# Patient Record
Sex: Male | Born: 1995 | Race: White | Hispanic: No | Marital: Married | Smoking: Current every day smoker
Health system: Southern US, Community
[De-identification: ages and names within clinical notes are randomized; demographics above are authoritative.]

## PROBLEM LIST (undated history)

## (undated) DIAGNOSIS — R519 Headache, unspecified: Secondary | ICD-10-CM

## (undated) DIAGNOSIS — S22009A Unspecified fracture of unspecified thoracic vertebra, initial encounter for closed fracture: Secondary | ICD-10-CM

## (undated) DIAGNOSIS — S0219XA Other fracture of base of skull, initial encounter for closed fracture: Secondary | ICD-10-CM

## (undated) DIAGNOSIS — K219 Gastro-esophageal reflux disease without esophagitis: Secondary | ICD-10-CM

## (undated) DIAGNOSIS — S0285XA Fracture of orbit, unspecified, initial encounter for closed fracture: Secondary | ICD-10-CM

## (undated) DIAGNOSIS — S42109A Fracture of unspecified part of scapula, unspecified shoulder, initial encounter for closed fracture: Secondary | ICD-10-CM

## (undated) DIAGNOSIS — I609 Nontraumatic subarachnoid hemorrhage, unspecified: Secondary | ICD-10-CM

## (undated) HISTORY — DX: Gastro-esophageal reflux disease without esophagitis: K21.9

---
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
## 2016-05-04 ENCOUNTER — Encounter: Payer: Self-pay | Admitting: Family Medicine

## 2016-05-04 ENCOUNTER — Ambulatory Visit (INDEPENDENT_AMBULATORY_CARE_PROVIDER_SITE_OTHER): Payer: BLUE CROSS/BLUE SHIELD | Admitting: Family Medicine

## 2016-05-04 VITALS — BP 110/62 | HR 72 | Ht 73.0 in | Wt 267.0 lb

## 2016-05-04 DIAGNOSIS — K219 Gastro-esophageal reflux disease without esophagitis: Secondary | ICD-10-CM | POA: Diagnosis not present

## 2016-05-04 DIAGNOSIS — R519 Headache, unspecified: Secondary | ICD-10-CM

## 2016-05-04 DIAGNOSIS — G8929 Other chronic pain: Secondary | ICD-10-CM

## 2016-05-04 DIAGNOSIS — R51 Headache: Secondary | ICD-10-CM | POA: Diagnosis not present

## 2016-05-04 MED ORDER — ESOMEPRAZOLE MAGNESIUM 40 MG PO CPDR: 40.0000 mg | DELAYED_RELEASE_CAPSULE | Freq: Every day | ORAL | Status: DC
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
## 2016-05-25 DIAGNOSIS — K219 Gastro-esophageal reflux disease without esophagitis: Secondary | ICD-10-CM | POA: Diagnosis not present

## 2016-05-25 DIAGNOSIS — M62830 Muscle spasm of back: Secondary | ICD-10-CM | POA: Diagnosis not present

## 2016-05-25 DIAGNOSIS — M546 Pain in thoracic spine: Secondary | ICD-10-CM | POA: Diagnosis not present

## 2016-06-29 ENCOUNTER — Ambulatory Visit: Payer: Self-pay | Admitting: Neurology

## 2016-08-06 ENCOUNTER — Ambulatory Visit: Payer: Self-pay | Admitting: Neurology

## 2016-11-15 DIAGNOSIS — S60211A Contusion of right wrist, initial encounter: Secondary | ICD-10-CM | POA: Diagnosis not present

## 2016-12-21 HISTORY — PX: KNEE ARTHROSCOPY W/ MENISCAL REPAIR: SHX1877

## 2016-12-21 HISTORY — PX: KNEE ARTHROSCOPY WITH ANTERIOR CRUCIATE LIGAMENT (ACL) REPAIR: SHX5644

## 2017-01-09 DIAGNOSIS — J209 Acute bronchitis, unspecified: Secondary | ICD-10-CM | POA: Diagnosis not present

## 2017-01-09 DIAGNOSIS — J019 Acute sinusitis, unspecified: Secondary | ICD-10-CM | POA: Diagnosis not present

## 2017-04-09 ENCOUNTER — Ambulatory Visit (INDEPENDENT_AMBULATORY_CARE_PROVIDER_SITE_OTHER): Payer: BLUE CROSS/BLUE SHIELD | Admitting: Family Medicine

## 2017-04-09 ENCOUNTER — Encounter: Payer: Self-pay | Admitting: Family Medicine

## 2017-04-09 VITALS — BP 122/70 | HR 80 | Ht 73.0 in | Wt 262.0 lb

## 2017-04-09 DIAGNOSIS — S8391XA Sprain of unspecified site of right knee, initial encounter: Secondary | ICD-10-CM | POA: Diagnosis not present

## 2017-04-09 DIAGNOSIS — R55 Syncope and collapse: Secondary | ICD-10-CM | POA: Diagnosis not present

## 2017-04-09 DIAGNOSIS — S80211A Abrasion, right knee, initial encounter: Secondary | ICD-10-CM | POA: Diagnosis not present

## 2017-04-09 NOTE — Progress Notes (Signed)
Name: Brendan Ball   MRN: [id number]    DOB: 02/25/1996   Date:04/09/2017       Progress Note  Subjective  Chief Complaint  Chief Complaint  Patient presents with  . Near Syncope    was cleaning a room at work yesterday- suddenly saw black spots and was told he turned white, never went completely out. Went and sat down and drank water and put cold rag on head and felt better.    Near Syncope  This is a new problem. The current episode started yesterday. Episode frequency: one episode. Associated symptoms include a visual change. Pertinent negatives include no abdominal pain, anorexia, arthralgias, change in bowel habit, chest pain, chills, congestion, coughing, diaphoresis, fatigue, fever, headaches, joint swelling, myalgias, nausea, neck pain, numbness, rash, sore throat, swollen glands, urinary symptoms, vertigo, vomiting or weakness. The symptoms are aggravated by bending (cleaning carpet). He has tried nothing for the symptoms.    No problem-specific Assessment & Plan notes found for this encounter.   Past Medical History:  Diagnosis Date  . GERD (gastroesophageal reflux disease)     No past surgical history on file.  Family History  Problem Relation Age of Onset  . Diabetes Mother   . Hypertension Father   . Cancer Maternal Grandmother   . Diabetes Maternal Grandfather   . Heart disease Paternal Grandfather     Social History   Social History  . Marital status: Single    Spouse name: N/A  . Number of children: N/A  . Years of education: N/A   Occupational History  . Not on file.   Social History Main Topics  . Smoking status: Current Every Day Smoker    Types: Cigarettes  . Smokeless tobacco: Never Used  . Alcohol use No  . Drug use: No  . Sexual activity: Yes   Other Topics Concern  . Not on file   Social History Narrative  . No narrative on file    Allergies  Allergen Reactions  . Ibuprofen Anaphylaxis  . Sulfa Antibiotics Other (See Comments)     Outpatient Medications Prior to Visit  Medication Sig Dispense Refill  . esomeprazole (NEXIUM) 40 MG capsule Take 1 capsule (40 mg total) by mouth daily. 30 capsule 11   No facility-administered medications prior to visit.     Review of Systems  Constitutional: Negative for chills, diaphoresis, fatigue, fever, malaise/fatigue and weight loss.  HENT: Negative for congestion, ear discharge, ear pain and sore throat.   Eyes: Negative for blurred vision.  Respiratory: Negative for cough, sputum production, shortness of breath and wheezing.   Cardiovascular: Positive for near-syncope. Negative for chest pain, palpitations, orthopnea and leg swelling.  Gastrointestinal: Negative for abdominal pain, anorexia, blood in stool, change in bowel habit, constipation, diarrhea, heartburn, melena, nausea and vomiting.  Genitourinary: Negative for dysuria, frequency, hematuria and urgency.       No nausea  Musculoskeletal: Negative for arthralgias, back pain, joint pain, joint swelling, myalgias and neck pain.  Skin: Negative for rash.  Neurological: Negative for dizziness, vertigo, tingling, sensory change, focal weakness, weakness, numbness and headaches.  Endo/Heme/Allergies: Negative for environmental allergies and polydipsia. Does not bruise/bleed easily.  Psychiatric/Behavioral: Negative for depression and suicidal ideas. The patient is not nervous/anxious and does not have insomnia.      Objective  Vitals:   04/09/17 1041  BP: 122/70  Pulse: 80  SpO2: 99%  Weight: 262 lb (118.8 kg)  Height:  (1.854 m)    Physical  Exam  Constitutional: He is oriented to person, place, and time and well-developed, well-nourished, and in no distress.  HENT:  Head: Normocephalic.  Right Ear: External ear normal.  Left Ear: External ear normal.  Nose: Nose normal.  Mouth/Throat: Oropharynx is clear and moist.  Eyes: Conjunctivae and EOM are normal. Pupils are equal, round, and reactive to  light. Right eye exhibits no discharge. Left eye exhibits no discharge. No scleral icterus.  Neck: Normal range of motion. Neck supple. No JVD present. No tracheal deviation present. No thyromegaly present.  Cardiovascular: Normal rate, regular rhythm, S1 normal, S2 normal, normal heart sounds and intact distal pulses.  Exam reveals no gallop, no S3, no S4 and no friction rub.   No murmur heard. Pulmonary/Chest: Breath sounds normal. No respiratory distress. He has no wheezes. He has no rales.  Abdominal: Soft. Bowel sounds are normal. He exhibits no mass. There is no hepatosplenomegaly. There is no tenderness. There is no rebound, no guarding and no CVA tenderness.  Musculoskeletal: Normal range of motion. He exhibits no edema or tenderness.  Lymphadenopathy:    He has no cervical adenopathy.  Neurological: He is alert and oriented to person, place, and time. He has normal strength and intact cranial nerves. No cranial nerve deficit.  Skin: Skin is warm. No rash noted.  Psychiatric: Mood and affect normal.  Nursing note and vitals reviewed.     Assessment & Plan  Problem List Items Addressed This Visit    None    Visit Diagnoses    Near syncope    -  Primary   Relevant Orders   EKG 12-Lead (Completed)   Renal Function Panel   CBC with Differential/Platelet   Vasovagal episode          No orders of the defined types were placed in this encounter.     Dr. [facility]  04/09/17

## 2017-04-10 LAB — RENAL FUNCTION PANEL
Albumin: 4.6 g/dL (ref 3.5–5.5)
BUN/Creatinine Ratio: 16 (ref 9–20)
BUN: 16 mg/dL (ref 6–20)
CO2: 22 mmol/L (ref 18–29)
Calcium: 9.5 mg/dL (ref 8.7–10.2)
Chloride: 102 mmol/L (ref 96–106)
Creatinine, Ser: 0.98 mg/dL (ref 0.76–1.27)
GFR calc Af Amer: 125 mL/min/{1.73_m2} (ref >59)
GFR calc non Af Amer: 108 mL/min/{1.73_m2} (ref >59)
Glucose: 79 mg/dL (ref 65–99)
Phosphorus: 3.5 mg/dL (ref 2.5–4.5)
Potassium: 4.7 mmol/L (ref 3.5–5.2)
Sodium: 141 mmol/L (ref 134–144)

## 2017-04-10 LAB — CBC WITH DIFFERENTIAL/PLATELET
Basophils Absolute: 0.1 10*3/uL (ref 0.0–0.2)
Basos: 0 %
EOS (ABSOLUTE): 0.3 10*3/uL (ref 0.0–0.4)
Eos: 1 %
Hematocrit: 46.3 % (ref 37.5–51.0)
Hemoglobin: 15.7 g/dL (ref 13.0–17.7)
Immature Grans (Abs): 0 10*3/uL (ref 0.0–0.1)
Immature Granulocytes: 0 %
Lymphocytes Absolute: 3.7 10*3/uL — ABNORMAL HIGH (ref 0.7–3.1)
Lymphs: 19 %
MCH: 29.9 pg (ref 26.6–33.0)
MCHC: 33.9 g/dL (ref 31.5–35.7)
MCV: 88 fL (ref 79–97)
Monocytes Absolute: 1.2 10*3/uL — ABNORMAL HIGH (ref 0.1–0.9)
Monocytes: 6 %
Neutrophils Absolute: 14 10*3/uL — ABNORMAL HIGH (ref 1.4–7.0)
Neutrophils: 74 %
Platelets: 207 10*3/uL (ref 150–379)
RBC: 5.25 x10E6/uL (ref 4.14–5.80)
RDW: 13.6 % (ref 12.3–15.4)
WBC: 19.2 10*3/uL — ABNORMAL HIGH (ref 3.4–10.8)

## 2017-04-12 ENCOUNTER — Other Ambulatory Visit: Payer: Self-pay

## 2017-04-12 DIAGNOSIS — S83411A Sprain of medial collateral ligament of right knee, initial encounter: Secondary | ICD-10-CM | POA: Diagnosis not present

## 2017-04-12 MED ORDER — AMOXICILLIN 500 MG PO CAPS: 500.0000 mg | ORAL_CAPSULE | Freq: Three times a day (TID) | ORAL | 0 refills | Status: DC

## 2017-04-20 DIAGNOSIS — S83411D Sprain of medial collateral ligament of right knee, subsequent encounter: Secondary | ICD-10-CM | POA: Diagnosis not present

## 2017-06-03 ENCOUNTER — Other Ambulatory Visit: Payer: Self-pay

## 2017-06-03 DIAGNOSIS — K219 Gastro-esophageal reflux disease without esophagitis: Secondary | ICD-10-CM

## 2017-06-03 MED ORDER — ESOMEPRAZOLE MAGNESIUM 40 MG PO CPDR: 40.0000 mg | DELAYED_RELEASE_CAPSULE | Freq: Every day | ORAL | 11 refills | Status: DC

## 2017-06-14 DIAGNOSIS — S83221A Peripheral tear of medial meniscus, current injury, right knee, initial encounter: Secondary | ICD-10-CM | POA: Diagnosis not present

## 2017-06-16 DIAGNOSIS — S83221A Peripheral tear of medial meniscus, current injury, right knee, initial encounter: Secondary | ICD-10-CM | POA: Diagnosis not present

## 2017-06-22 DIAGNOSIS — S83511A Sprain of anterior cruciate ligament of right knee, initial encounter: Secondary | ICD-10-CM | POA: Diagnosis not present

## 2017-07-12 DIAGNOSIS — Z01818 Encounter for other preprocedural examination: Secondary | ICD-10-CM | POA: Diagnosis not present

## 2017-07-14 DIAGNOSIS — S83511D Sprain of anterior cruciate ligament of right knee, subsequent encounter: Secondary | ICD-10-CM | POA: Diagnosis not present

## 2017-07-14 DIAGNOSIS — G8918 Other acute postprocedural pain: Secondary | ICD-10-CM | POA: Diagnosis not present

## 2017-07-14 DIAGNOSIS — M25561 Pain in right knee: Secondary | ICD-10-CM | POA: Diagnosis not present

## 2017-07-14 DIAGNOSIS — F17210 Nicotine dependence, cigarettes, uncomplicated: Secondary | ICD-10-CM | POA: Diagnosis not present

## 2017-07-14 DIAGNOSIS — K219 Gastro-esophageal reflux disease without esophagitis: Secondary | ICD-10-CM | POA: Diagnosis not present

## 2017-07-14 DIAGNOSIS — S83511A Sprain of anterior cruciate ligament of right knee, initial encounter: Secondary | ICD-10-CM | POA: Diagnosis not present

## 2017-07-14 DIAGNOSIS — S83261D Peripheral tear of lateral meniscus, current injury, right knee, subsequent encounter: Secondary | ICD-10-CM | POA: Diagnosis not present

## 2017-07-14 DIAGNOSIS — S83271A Complex tear of lateral meniscus, current injury, right knee, initial encounter: Secondary | ICD-10-CM | POA: Diagnosis not present

## 2017-07-16 DIAGNOSIS — S83511D Sprain of anterior cruciate ligament of right knee, subsequent encounter: Secondary | ICD-10-CM | POA: Diagnosis not present

## 2017-07-19 DIAGNOSIS — S83511D Sprain of anterior cruciate ligament of right knee, subsequent encounter: Secondary | ICD-10-CM | POA: Diagnosis not present

## 2017-07-21 DIAGNOSIS — S83511D Sprain of anterior cruciate ligament of right knee, subsequent encounter: Secondary | ICD-10-CM | POA: Diagnosis not present

## 2017-07-23 DIAGNOSIS — S83511D Sprain of anterior cruciate ligament of right knee, subsequent encounter: Secondary | ICD-10-CM | POA: Diagnosis not present

## 2017-07-27 DIAGNOSIS — S83511D Sprain of anterior cruciate ligament of right knee, subsequent encounter: Secondary | ICD-10-CM | POA: Diagnosis not present

## 2017-07-27 DIAGNOSIS — R269 Unspecified abnormalities of gait and mobility: Secondary | ICD-10-CM | POA: Diagnosis not present

## 2017-07-30 DIAGNOSIS — S83511D Sprain of anterior cruciate ligament of right knee, subsequent encounter: Secondary | ICD-10-CM | POA: Diagnosis not present

## 2017-08-03 DIAGNOSIS — S83511D Sprain of anterior cruciate ligament of right knee, subsequent encounter: Secondary | ICD-10-CM | POA: Diagnosis not present

## 2017-08-06 DIAGNOSIS — S83511D Sprain of anterior cruciate ligament of right knee, subsequent encounter: Secondary | ICD-10-CM | POA: Diagnosis not present

## 2017-08-10 DIAGNOSIS — S83511D Sprain of anterior cruciate ligament of right knee, subsequent encounter: Secondary | ICD-10-CM | POA: Diagnosis not present

## 2017-08-12 DIAGNOSIS — S83511D Sprain of anterior cruciate ligament of right knee, subsequent encounter: Secondary | ICD-10-CM | POA: Diagnosis not present

## 2017-08-17 DIAGNOSIS — S83511D Sprain of anterior cruciate ligament of right knee, subsequent encounter: Secondary | ICD-10-CM | POA: Diagnosis not present

## 2017-08-19 DIAGNOSIS — S83511D Sprain of anterior cruciate ligament of right knee, subsequent encounter: Secondary | ICD-10-CM | POA: Diagnosis not present

## 2018-01-20 DIAGNOSIS — J019 Acute sinusitis, unspecified: Secondary | ICD-10-CM | POA: Diagnosis not present

## 2018-03-16 DIAGNOSIS — R197 Diarrhea, unspecified: Secondary | ICD-10-CM | POA: Diagnosis not present

## 2018-03-16 DIAGNOSIS — L304 Erythema intertrigo: Secondary | ICD-10-CM | POA: Diagnosis not present

## 2018-04-11 DIAGNOSIS — J029 Acute pharyngitis, unspecified: Secondary | ICD-10-CM | POA: Diagnosis not present

## 2018-04-11 DIAGNOSIS — Z6836 Body mass index (BMI) 36.0-36.9, adult: Secondary | ICD-10-CM | POA: Diagnosis not present

## 2018-04-11 DIAGNOSIS — J329 Chronic sinusitis, unspecified: Secondary | ICD-10-CM | POA: Diagnosis not present

## 2018-04-11 DIAGNOSIS — J020 Streptococcal pharyngitis: Secondary | ICD-10-CM | POA: Diagnosis not present

## 2018-06-24 ENCOUNTER — Other Ambulatory Visit: Payer: Self-pay | Admitting: Family Medicine

## 2018-06-24 DIAGNOSIS — K219 Gastro-esophageal reflux disease without esophagitis: Secondary | ICD-10-CM

## 2018-07-14 DIAGNOSIS — M25561 Pain in right knee: Secondary | ICD-10-CM | POA: Diagnosis not present

## 2018-08-11 DIAGNOSIS — M25561 Pain in right knee: Secondary | ICD-10-CM | POA: Diagnosis not present

## 2018-08-17 DIAGNOSIS — Z0189 Encounter for other specified special examinations: Secondary | ICD-10-CM | POA: Diagnosis not present

## 2018-08-17 DIAGNOSIS — M25561 Pain in right knee: Secondary | ICD-10-CM | POA: Diagnosis not present

## 2018-08-25 DIAGNOSIS — S83281D Other tear of lateral meniscus, current injury, right knee, subsequent encounter: Secondary | ICD-10-CM | POA: Diagnosis not present

## 2018-08-25 DIAGNOSIS — S83511D Sprain of anterior cruciate ligament of right knee, subsequent encounter: Secondary | ICD-10-CM | POA: Diagnosis not present

## 2018-09-01 DIAGNOSIS — S83511D Sprain of anterior cruciate ligament of right knee, subsequent encounter: Secondary | ICD-10-CM | POA: Diagnosis not present

## 2018-12-15 ENCOUNTER — Other Ambulatory Visit: Payer: Self-pay | Admitting: Family Medicine

## 2018-12-15 DIAGNOSIS — K219 Gastro-esophageal reflux disease without esophagitis: Secondary | ICD-10-CM

## 2019-01-17 ENCOUNTER — Encounter: Payer: Self-pay | Admitting: Family Medicine

## 2019-01-17 ENCOUNTER — Ambulatory Visit (INDEPENDENT_AMBULATORY_CARE_PROVIDER_SITE_OTHER): Payer: BLUE CROSS/BLUE SHIELD | Admitting: Family Medicine

## 2019-01-17 VITALS — BP 120/62 | HR 100 | Ht 73.0 in | Wt 286.0 lb

## 2019-01-17 DIAGNOSIS — J0100 Acute maxillary sinusitis, unspecified: Secondary | ICD-10-CM | POA: Diagnosis not present

## 2019-01-17 DIAGNOSIS — K219 Gastro-esophageal reflux disease without esophagitis: Secondary | ICD-10-CM | POA: Diagnosis not present

## 2019-01-17 MED ORDER — ESOMEPRAZOLE MAGNESIUM 40 MG PO CPDR: 40.0000 mg | DELAYED_RELEASE_CAPSULE | Freq: Every day | ORAL | 1 refills | Status: DC

## 2019-01-17 MED ORDER — AZITHROMYCIN 250 MG PO TABS: ORAL_TABLET | ORAL | 0 refills | Status: DC

## 2019-01-17 NOTE — Progress Notes (Signed)
Date:  01/17/2019   Name:  Brendan Ball   DOB:  1996/09/11   MRN:  [id number]   Chief Complaint: Gastroesophageal Reflux and Sinusitis (cong, cough x 4 days)  Gastroesophageal Reflux  He complains of heartburn. He reports no abdominal pain, no belching, no chest pain, no choking, no coughing, no dysphagia, no early satiety, no globus sensation, no hoarse voice, no nausea, no sore throat, no stridor or no wheezing. This is a chronic problem. The current episode started more than 1 year ago. The problem occurs rarely. The problem has been gradually improving. The heartburn is of mild intensity. The heartburn does not wake him from sleep. The heartburn does not limit his activity. The symptoms are aggravated by certain foods. Pertinent negatives include no anemia, fatigue, melena, muscle weakness or orthopnea. He has tried a PPI for the symptoms. The treatment provided moderate relief.  Sinusitis  This is a new problem. The current episode started in the past 7 days (onset Saturday). The problem has been waxing and waning since onset. There has been no fever. Associated symptoms include congestion, headaches and sinus pressure. Pertinent negatives include no chills, coughing, diaphoresis, ear pain, hoarse voice, neck pain, shortness of breath or sore throat. Past treatments include oral decongestants. The treatment provided mild relief.    Review of Systems  Constitutional: Negative for chills, diaphoresis, fatigue and fever.  HENT: Positive for congestion and sinus pressure. Negative for drooling, ear discharge, ear pain, hoarse voice and sore throat.   Respiratory: Negative for cough, choking, shortness of breath and wheezing.   Cardiovascular: Negative for chest pain, palpitations and leg swelling.  Gastrointestinal: Positive for heartburn. Negative for abdominal pain, blood in stool, constipation, diarrhea, dysphagia, melena and nausea.  Endocrine: Negative for polydipsia.  Genitourinary:  Negative for dysuria, frequency, hematuria and urgency.  Musculoskeletal: Negative for back pain, myalgias, muscle weakness and neck pain.  Skin: Negative for rash.  Allergic/Immunologic: Negative for environmental allergies.  Neurological: Positive for headaches. Negative for dizziness.  Hematological: Does not bruise/bleed easily.  Psychiatric/Behavioral: Negative for suicidal ideas. The patient is not nervous/anxious.     Patient Active Problem List   Diagnosis Date Noted  . Fracture of scapula 10/31/2012  . Fracture of orbit 10/12/2012  . 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 Fracture of temporal bone (HCC) 10/12/2012  . Fracture of thoracic spine (HCC) 10/12/2012    Allergies  Allergen Reactions  . Ibuprofen Anaphylaxis  . Sulfa Antibiotics Other (See Comments)    No past surgical history on file.  Social History   Tobacco Use  . Smoking status: Current Every Day Smoker    Types: Cigarettes  . Smokeless tobacco: Never Used  Substance Use Topics  . Alcohol use: No    Alcohol/week: 0.0 standard drinks  . Drug use: No     Medication list has been reviewed and updated.  Current Meds  Medication Sig  . esomeprazole (NEXIUM) 40 MG capsule TAKE ONE CAPSULE BY MOUTH DAILY    No flowsheet data found.  Physical Exam Vitals signs and nursing note reviewed.  HENT:     Head: Normocephalic.     Right Ear: Tympanic membrane, ear canal and external ear normal.     Left Ear: Tympanic membrane, ear canal and external ear normal.     Nose: Rhinorrhea present. No nasal deformity, signs of injury, nasal tenderness or congestion.     Right Sinus: No maxillary sinus tenderness or frontal sinus tenderness.  Left Sinus: Maxillary sinus tenderness present. No frontal sinus tenderness.     Mouth/Throat:     Pharynx: Oropharynx is clear. Uvula midline. No pharyngeal swelling or oropharyngeal exudate.  Eyes:     General: No scleral icterus.        Right eye: No discharge.        Left eye: No discharge.     Conjunctiva/sclera: Conjunctivae normal.     Pupils: Pupils are equal, round, and reactive to light.  Neck:     Musculoskeletal: Normal range of motion and neck supple.     Thyroid: No thyromegaly.     Vascular: No JVD.     Trachea: No tracheal deviation.  Cardiovascular:     Rate and Rhythm: Normal rate and regular rhythm.     Heart sounds: Normal heart sounds. No murmur. No friction rub. No gallop.   Pulmonary:     Effort: No respiratory distress.     Breath sounds: Normal breath sounds. No decreased air movement. No decreased breath sounds, wheezing, rhonchi or rales.  Abdominal:     General: Bowel sounds are normal.     Palpations: Abdomen is soft. There is no mass.     Tenderness: There is no abdominal tenderness. There is no guarding or rebound.  Musculoskeletal: Normal range of motion.        General: No tenderness.  Lymphadenopathy:     Head:     Right side of head: No submandibular adenopathy.     Left side of head: No submandibular adenopathy.     Cervical: No cervical adenopathy.  Skin:    General: Skin is warm.     Findings: No rash.  Neurological:     Mental Status: He is alert and oriented to person, place, and time.     Cranial Nerves: No cranial nerve deficit.     Deep Tendon Reflexes: Reflexes are normal and symmetric.     BP 120/62   Pulse 100   Ht 6\' 1"  (1.854 m)   Wt 286 lb (129.7 kg)   BMI 37.73 kg/m     Assessment and Plan: 1. Gastroesophageal reflux disease, esophagitis presence not specified Chronic.  Persistent.  Will continue Nexium 40 mg once a day - esomeprazole (NEXIUM) 40 MG capsule; Take 1 capsule (40 mg total) by mouth daily.  Dispense: 90 capsule; Refill: 1  2. Acute maxillary sinusitis, recurrence not specified Acute.  Exam consistent with left maxillary sinusitis initiate a azithromycin 250 mg 2 today followed by 1 a day for 4 days. - azithromycin (ZITHROMAX) 250 MG tablet;  2 today then 1 a day  For 4 days  Dispense: 6 tablet; Refill: 0

## 2019-05-29 ENCOUNTER — Other Ambulatory Visit: Payer: Self-pay

## 2019-05-29 DIAGNOSIS — K219 Gastro-esophageal reflux disease without esophagitis: Secondary | ICD-10-CM

## 2019-05-29 MED ORDER — ESOMEPRAZOLE MAGNESIUM 40 MG PO CPDR: 40.0000 mg | DELAYED_RELEASE_CAPSULE | Freq: Every day | ORAL | 1 refills | Status: DC

## 2019-07-04 ENCOUNTER — Other Ambulatory Visit: Payer: Self-pay

## 2019-07-04 ENCOUNTER — Encounter: Payer: Self-pay | Admitting: Family Medicine

## 2019-07-04 ENCOUNTER — Ambulatory Visit (INDEPENDENT_AMBULATORY_CARE_PROVIDER_SITE_OTHER): Payer: BC Managed Care – PPO | Admitting: Family Medicine

## 2019-07-04 VITALS — BP 118/60 | HR 80 | Ht 73.0 in | Wt 274.0 lb

## 2019-07-04 DIAGNOSIS — M791 Myalgia, unspecified site: Secondary | ICD-10-CM

## 2019-07-04 DIAGNOSIS — K219 Gastro-esophageal reflux disease without esophagitis: Secondary | ICD-10-CM | POA: Diagnosis not present

## 2019-07-04 DIAGNOSIS — B379 Candidiasis, unspecified: Secondary | ICD-10-CM | POA: Diagnosis not present

## 2019-07-04 MED ORDER — ESOMEPRAZOLE MAGNESIUM 40 MG PO CPDR: 40.0000 mg | DELAYED_RELEASE_CAPSULE | Freq: Every day | ORAL | 3 refills | Status: DC

## 2019-07-04 MED ORDER — NYSTATIN 100000 UNIT/GM EX CREA: 1.0000 "application " | TOPICAL_CREAM | Freq: Two times a day (BID) | CUTANEOUS | 0 refills | Status: DC

## 2019-07-04 NOTE — Progress Notes (Signed)
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 This is a chronic problem. The current episode started more than 1 year ago. The problem has been waxing and waning. Pertinent negatives include no anemia, fatigue, melena, muscle weakness, orthopnea or weight loss. There are no known risk factors. He has tried a PPI for the symptoms. The treatment provided moderate relief. Past procedures do not include an abdominal ultrasound, an EGD, esophageal manometry, esophageal pH monitoring, H. pylori antibody titer or a UGI.  Muscle Pain This is a chronic problem. The current episode started 1 to 4 weeks ago. The problem occurs intermittently. The problem has been waxing and waning since onset. Pain location: arms and legs. The pain is mild. Nothing aggravates the symptoms. Pertinent negatives include no abdominal pain, chest pain, constipation, diarrhea, dysuria, fatigue, fever, headaches, nausea, rash, shortness of breath, weakness or wheezing. Treatments tried: hydration.    Review of Systems  Constitutional: Negative for chills, fatigue, fever and weight loss.  HENT: Negative for drooling, ear discharge, ear pain, hoarse voice and sore throat.   Respiratory: Negative for cough, choking, shortness of breath and wheezing.   Cardiovascular: Negative for chest pain, palpitations and leg swelling.  Gastrointestinal: Negative for abdominal pain, blood in stool, constipation, diarrhea,  dysphagia, heartburn, melena and nausea.  Endocrine: Negative for polydipsia.  Genitourinary: Negative for dysuria, frequency, hematuria and urgency.  Musculoskeletal: Positive for myalgias. Negative for arthralgias, back pain, muscle weakness, neck pain and neck stiffness.  Skin: Negative for rash.  Allergic/Immunologic: Negative for environmental allergies.  Neurological: Negative for dizziness, tremors, weakness, numbness and headaches.  Hematological: Does not bruise/bleed easily.  Psychiatric/Behavioral: Negative for suicidal ideas. The patient is not nervous/anxious.     Patient Active Problem List   Diagnosis Date Noted  . Fracture of scapula 10/31/2012  . Fracture of orbit 10/12/2012  . Hemorrhage into subarachnoid space of neuraxis ([place]) 10/12/2012  . Fracture of temporal bone ([place]) 10/12/2012  . Fracture of thoracic spine ([place]) 10/12/2012    Allergies  Allergen Reactions  . Ibuprofen Anaphylaxis  . Sulfa Antibiotics Other (See Comments)    No past surgical history on file.  Social History   Tobacco Use  . Smoking status: Current Every Day Smoker    Types: Cigarettes  . Smokeless tobacco: Never Used  Substance Use Topics  . Alcohol use: No    Alcohol/week: 0.0 standard drinks  . Drug use: No     Medication list has been reviewed and updated.  Current Meds  Medication Sig  . esomeprazole (NEXIUM) 40 MG capsule Take 1 capsule (40 mg total) by mouth daily.    PHQ 2/9 Scores 07/04/2019  PHQ - 2 Score 0  PHQ- 9 Score 2    BP Readings from Last 3 Encounters:  07/04/19 118/60  01/17/19 120/62  04/09/17 122/70    Physical Exam Vitals signs and nursing note reviewed.  HENT:     Head: Normocephalic.  Right Ear: Tympanic membrane, ear canal and external ear normal.     Left Ear: Tympanic membrane, ear canal and external ear normal.     Nose: Nose normal. No congestion or rhinorrhea.     Mouth/Throat:     Mouth: Mucous membranes are moist.  Eyes:      General: No scleral icterus.       Right eye: No discharge.        Left eye: No discharge.     Conjunctiva/sclera: Conjunctivae normal.     Pupils: Pupils are equal, round, and reactive to light.  Neck:     Musculoskeletal: Normal range of motion and neck supple. No muscular tenderness.     Thyroid: No thyromegaly.     Vascular: No carotid bruit or JVD.     Trachea: No tracheal deviation.  Cardiovascular:     Rate and Rhythm: Normal rate and regular rhythm.     Pulses:          Carotid pulses are 2+ on the right side and 2+ on the left side.      Radial pulses are 2+ on the right side and 2+ on the left side.       Femoral pulses are 2+ on the right side and 2+ on the left side.      Popliteal pulses are 2+ on the right side and 2+ on the left side.       Dorsalis pedis pulses are 2+ on the right side and 2+ on the left side.       Posterior tibial pulses are 2+ on the right side and 2+ on the left side.     Heart sounds: Normal heart sounds, S1 normal and S2 normal. No murmur. No systolic murmur. No diastolic murmur. No friction rub. No gallop. No S3 or S4 sounds.   Pulmonary:     Effort: No respiratory distress.     Breath sounds: Normal breath sounds. No wheezing or rales.  Abdominal:     General: Bowel sounds are normal.     Palpations: Abdomen is soft. There is no mass.     Tenderness: There is no abdominal tenderness. There is no guarding or rebound.  Musculoskeletal: Normal range of motion.        General: No tenderness.     Right lower leg: No edema.     Left lower leg: No edema.  Lymphadenopathy:     Cervical: No cervical adenopathy.  Skin:    General: Skin is warm.     Findings: No rash.  Neurological:     Mental Status: He is alert and oriented to person, place, and time.     Cranial Nerves: No cranial nerve deficit.     Deep Tendon Reflexes: Reflexes are normal and symmetric.     Wt Readings from Last 3 Encounters:  07/04/19 274 lb (124.3 kg)  01/17/19 286 lb  (129.7 kg)  04/09/17 262 lb (118.8 kg)    BP 118/60   Pulse 80   Ht 6\' 1"  (1.854 m)   Wt 274 lb (124.3 kg)   BMI 36.15 kg/m   Assessment and Plan: 1. Myalgia Patient has been having muscle cramps on a more frequent basis.  We have discussed his hydration status, electrolyte necessity, as well as muscle fatigue, all of which are possibilities given his line of work.  We will check a renal function panel as well as an A1c as well as a creatinine kinase to rule  out any rhabdo possibilities.  In the meantime information on trip channels was given and the relationship to muscle cramps. - Renal Function Panel - Hemoglobin A1c - CK (Creatine Kinase)  2. Gastroesophageal reflux disease, esophagitis presence not specified Chronic.  Controlled.  Continue Nexium 40 mg once a day - esomeprazole (NEXIUM) 40 MG capsule; Take 1 capsule (40 mg total) by mouth daily.  Dispense: 90 capsule; Refill: 3  3. Yeast infection Acute.  Noted due to swelling in the lower abdomen area will apply nystatin cream twice a day. - nystatin cream (MYCOSTATIN); Apply 1 application topically 2 (two) times daily.  Dispense: 30 g; Refill: 0

## 2019-07-04 NOTE — Patient Instructions (Signed)
Muscle Cramps and Spasms Muscle cramps and spasms occur when a muscle or muscles tighten and you have no control over this tightening (involuntary muscle contraction). They are a common problem and can develop in any muscle. The most common place is in the calf muscles of the leg. Muscle cramps and muscle spasms are both involuntary muscle contractions, but there are some differences between the two:  Muscle cramps are painful. They come and go and may last for a few seconds or up to 15 minutes. Muscle cramps are often more forceful and last longer than muscle spasms.  Muscle spasms may or may not be painful. They may also last just a few seconds or much longer. Certain medical conditions, such as diabetes or Parkinson's disease, can make it more likely to develop cramps or spasms. However, cramps or spasms are usually not caused by a serious underlying problem. Common causes include:  Doing more physical work or exercise than your body is ready for (overexertion).  Overuse from repeating certain movements too many times.  Remaining in a certain position for a long period of time.  Improper preparation, form, or technique while playing a sport or doing an activity.  Dehydration.  Injury.  Side effects of some medicines.  Abnormally low levels of the salts and minerals in your blood (electrolytes), especially potassium and calcium. This could happen if you are taking water pills (diuretics) or if you are pregnant. In many cases, the cause of muscle cramps or spasms is not known. Follow these instructions at home: Managing pain and stiffness      Try massaging, stretching, and relaxing the affected muscle. Do this for several minutes at a time.  If directed, apply heat to tight or tense muscles as often as told by your health care provider. Use the heat source that your health care provider recommends, such as a moist heat pack or a heating pad. ? Place a towel between your skin and  the heat source. ? Leave the heat on for 20-30 minutes. ? Remove the heat if your skin turns bright red. This is especially important if you are unable to feel pain, heat, or cold. You may have a greater risk of getting burned.  If directed, put ice on the affected area. This may help if you are sore or have pain after a cramp or spasm. ? Put ice in a plastic bag. ? Place a towel between your skin and the bag. ? Leavethe ice on for 20 minutes, 2-3 times a day.  Try taking hot showers or baths to help relax tight muscles. Eating and drinking  Drink enough fluid to keep your urine pale yellow. Staying well hydrated may help prevent cramps or spasms.  Eat a healthy diet that includes plenty of nutrients to help your muscles function. A healthy diet includes fruits and vegetables, lean protein, whole grains, and low-fat or nonfat dairy products. General instructions  If you are having frequent cramps, avoid intense exercise for several days.  Take over-the-counter and prescription medicines only as told by your health care provider.  Pay attention to any changes in your symptoms.  Keep all follow-up visits as told by your health care provider. This is important. Contact a health care provider if:  Your cramps or spasms get more severe or happen more often.  Your cramps or spasms do not improve over time. Summary  Muscle cramps and spasms occur when a muscle or muscles tighten and you have no control over this   tightening (involuntary muscle contraction).  The most common place for cramps or spasms to occur is in the calf muscles of the leg.  Massaging, stretching, and relaxing the affected muscle may relieve the cramp or spasm.  Drink enough fluid to keep your urine pale yellow. Staying well hydrated may help prevent cramps or spasms. This information is not intended to replace advice given to you by your health care provider. Make sure you discuss any questions you have with your  health care provider. Document Released: 05/29/2002 Document Revised: 05/02/2018 Document Reviewed: 05/02/2018 Elsevier Patient Education  2020 [employer]  

## 2019-07-05 LAB — RENAL FUNCTION PANEL
Albumin: 4.9 g/dL (ref 4.1–5.2)
BUN/Creatinine Ratio: 21 — ABNORMAL HIGH (ref 9–20)
BUN: 23 mg/dL — ABNORMAL HIGH (ref 6–20)
CO2: 18 mmol/L — ABNORMAL LOW (ref 20–29)
Calcium: 9.5 mg/dL (ref 8.7–10.2)
Chloride: 101 mmol/L (ref 96–106)
Creatinine, Ser: 1.09 mg/dL (ref 0.76–1.27)
GFR calc Af Amer: 108 mL/min/{1.73_m2} (ref >59)
GFR calc non Af Amer: 94 mL/min/{1.73_m2} (ref >59)
Glucose: 71 mg/dL (ref 65–99)
Phosphorus: 3.9 mg/dL (ref 2.8–4.1)
Potassium: 4.4 mmol/L (ref 3.5–5.2)
Sodium: 137 mmol/L (ref 134–144)

## 2019-07-05 LAB — HEMOGLOBIN A1C
Est. average glucose Bld gHb Est-mCnc: 105 mg/dL
Hgb A1c MFr Bld: 5.3 % (ref 4.8–5.6)

## 2019-07-05 LAB — CK: Total CK: 398 U/L (ref 49–439)

## 2019-07-19 ENCOUNTER — Ambulatory Visit: Payer: BLUE CROSS/BLUE SHIELD | Admitting: Family Medicine

## 2019-07-19 DIAGNOSIS — R197 Diarrhea, unspecified: Secondary | ICD-10-CM | POA: Diagnosis not present

## 2019-07-19 DIAGNOSIS — R110 Nausea: Secondary | ICD-10-CM | POA: Diagnosis not present

## 2019-08-09 ENCOUNTER — Ambulatory Visit (INDEPENDENT_AMBULATORY_CARE_PROVIDER_SITE_OTHER): Payer: BC Managed Care – PPO | Admitting: Family Medicine

## 2019-08-09 ENCOUNTER — Other Ambulatory Visit: Payer: Self-pay

## 2019-08-09 ENCOUNTER — Encounter: Payer: Self-pay | Admitting: Family Medicine

## 2019-08-09 VITALS — BP 120/68 | HR 80 | Ht 73.0 in | Wt 261.0 lb

## 2019-08-09 DIAGNOSIS — R51 Headache: Secondary | ICD-10-CM | POA: Diagnosis not present

## 2019-08-09 DIAGNOSIS — Z8781 Personal history of (healed) traumatic fracture: Secondary | ICD-10-CM

## 2019-08-09 DIAGNOSIS — R110 Nausea: Secondary | ICD-10-CM | POA: Diagnosis not present

## 2019-08-09 DIAGNOSIS — Z8679 Personal history of other diseases of the circulatory system: Secondary | ICD-10-CM

## 2019-08-09 DIAGNOSIS — Z9889 Other specified postprocedural states: Secondary | ICD-10-CM

## 2019-08-09 DIAGNOSIS — R519 Headache, unspecified: Secondary | ICD-10-CM

## 2019-08-09 MED ORDER — ONDANSETRON HCL 4 MG PO TABS: 4.0000 mg | ORAL_TABLET | Freq: Three times a day (TID) | ORAL | 0 refills | Status: DC | PRN
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
## 2019-08-31 DIAGNOSIS — E559 Vitamin D deficiency, unspecified: Secondary | ICD-10-CM | POA: Diagnosis not present

## 2019-08-31 DIAGNOSIS — G43119 Migraine with aura, intractable, without status migrainosus: Secondary | ICD-10-CM | POA: Diagnosis not present

## 2019-08-31 DIAGNOSIS — F5104 Psychophysiologic insomnia: Secondary | ICD-10-CM | POA: Diagnosis not present

## 2019-08-31 DIAGNOSIS — E538 Deficiency of other specified B group vitamins: Secondary | ICD-10-CM | POA: Diagnosis not present

## 2019-09-06 DIAGNOSIS — G43119 Migraine with aura, intractable, without status migrainosus: Secondary | ICD-10-CM | POA: Insufficient documentation

## 2019-10-06 DIAGNOSIS — Z23 Encounter for immunization: Secondary | ICD-10-CM | POA: Diagnosis not present

## 2020-01-01 DIAGNOSIS — G43119 Migraine with aura, intractable, without status migrainosus: Secondary | ICD-10-CM | POA: Diagnosis not present

## 2020-01-01 DIAGNOSIS — F5104 Psychophysiologic insomnia: Secondary | ICD-10-CM | POA: Diagnosis not present

## 2020-01-28 DIAGNOSIS — G4733 Obstructive sleep apnea (adult) (pediatric): Secondary | ICD-10-CM | POA: Diagnosis not present

## 2020-09-04 DIAGNOSIS — G43909 Migraine, unspecified, not intractable, without status migrainosus: Secondary | ICD-10-CM | POA: Diagnosis not present

## 2020-09-04 DIAGNOSIS — S0591XA Unspecified injury of right eye and orbit, initial encounter: Secondary | ICD-10-CM | POA: Diagnosis not present

## 2020-09-04 DIAGNOSIS — H905 Unspecified sensorineural hearing loss: Secondary | ICD-10-CM | POA: Diagnosis not present

## 2020-09-04 DIAGNOSIS — Z886 Allergy status to analgesic agent status: Secondary | ICD-10-CM | POA: Diagnosis not present

## 2020-09-04 DIAGNOSIS — K219 Gastro-esophageal reflux disease without esophagitis: Secondary | ICD-10-CM | POA: Diagnosis not present

## 2020-09-04 DIAGNOSIS — Z882 Allergy status to sulfonamides status: Secondary | ICD-10-CM | POA: Diagnosis not present

## 2020-09-04 DIAGNOSIS — T1501XA Foreign body in cornea, right eye, initial encounter: Secondary | ICD-10-CM | POA: Diagnosis not present

## 2020-09-04 DIAGNOSIS — Z79899 Other long term (current) drug therapy: Secondary | ICD-10-CM | POA: Diagnosis not present

## 2020-09-04 DIAGNOSIS — S0551XA Penetrating wound with foreign body of right eyeball, initial encounter: Secondary | ICD-10-CM | POA: Diagnosis not present

## 2020-09-04 DIAGNOSIS — F17210 Nicotine dependence, cigarettes, uncomplicated: Secondary | ICD-10-CM | POA: Diagnosis not present

## 2020-09-04 DIAGNOSIS — W311XXA Contact with metalworking machines, initial encounter: Secondary | ICD-10-CM | POA: Diagnosis not present

## 2020-09-06 DIAGNOSIS — T1501XA Foreign body in cornea, right eye, initial encounter: Secondary | ICD-10-CM | POA: Diagnosis not present

## 2020-09-13 DIAGNOSIS — T1501XD Foreign body in cornea, right eye, subsequent encounter: Secondary | ICD-10-CM | POA: Diagnosis not present

## 2020-09-16 ENCOUNTER — Other Ambulatory Visit: Payer: Self-pay | Admitting: Family Medicine

## 2020-09-16 DIAGNOSIS — K219 Gastro-esophageal reflux disease without esophagitis: Secondary | ICD-10-CM

## 2020-09-16 NOTE — Telephone Encounter (Signed)
Call to patient- he is at work and wants to call back to schedule- courtesy RF given.

## 2020-09-23 ENCOUNTER — Ambulatory Visit (INDEPENDENT_AMBULATORY_CARE_PROVIDER_SITE_OTHER): Payer: BC Managed Care – PPO | Admitting: Family Medicine

## 2020-09-23 ENCOUNTER — Other Ambulatory Visit: Payer: Self-pay

## 2020-09-23 ENCOUNTER — Encounter: Payer: Self-pay | Admitting: Family Medicine

## 2020-09-23 VITALS — BP 124/70 | HR 124 | Ht 73.0 in | Wt 306.0 lb

## 2020-09-23 DIAGNOSIS — M545 Low back pain, unspecified: Secondary | ICD-10-CM

## 2020-09-23 DIAGNOSIS — K219 Gastro-esophageal reflux disease without esophagitis: Secondary | ICD-10-CM

## 2020-09-23 MED ORDER — ESOMEPRAZOLE MAGNESIUM 40 MG PO CPDR: DELAYED_RELEASE_CAPSULE | ORAL | 3 refills | Status: DC

## 2020-09-23 MED ORDER — CYCLOBENZAPRINE HCL 10 MG PO TABS: 10.0000 mg | ORAL_TABLET | Freq: Three times a day (TID) | ORAL | 4 refills | Status: DC | PRN

## 2020-09-23 NOTE — Progress Notes (Signed)
Date:  09/23/2020   Name:  Brendan Ball   DOB:  Feb 15, 1996   MRN:  [id number]   Chief Complaint: Gastroesophageal Reflux (Takes Nexium- wants to have 90 days supply sent in or something cheaper per insurance.)  Gastroesophageal Reflux He complains of heartburn. He reports no abdominal pain, no belching, no chest pain, no choking, no coughing, no dysphagia, no early satiety, no globus sensation, no hoarse voice, no nausea, no sore throat, no stridor, no tooth decay, no water brash or no wheezing. This is a chronic problem. The current episode started more than 1 year ago. The problem occurs occasionally. The problem has been gradually improving. The heartburn is of mild intensity. The symptoms are aggravated by certain foods. Pertinent negatives include no anemia, fatigue, melena, muscle weakness, orthopnea or weight loss. There are no known risk factors. He has tried a PPI for the symptoms. The treatment provided moderate relief. Past invasive treatments do not include gastroplasty, gastroplication or reflux surgery.    Lab Results  Component Value Date   CREATININE 1.09 07/04/2019   BUN 23 (H) 07/04/2019   NA 137 07/04/2019   K 4.4 07/04/2019   CL 101 07/04/2019   CO2 18 (L) 07/04/2019   No results found for: CHOL, HDL, LDLCALC, LDLDIRECT, TRIG, CHOLHDL No results found for: TSH Lab Results  Component Value Date   HGBA1C 5.3 07/04/2019   Lab Results  Component Value Date   WBC 19.2 (H) 04/09/2017   HGB 15.7 04/09/2017   HCT 46.3 04/09/2017   MCV 88 04/09/2017   PLT 207 04/09/2017   No results found for: ALT, AST, GGT, ALKPHOS, BILITOT   Review of Systems  Constitutional: Negative for chills, fatigue, fever and weight loss.  HENT: Negative for drooling, ear discharge, ear pain, hoarse voice and sore throat.   Respiratory: Negative for cough, choking, shortness of breath and wheezing.   Cardiovascular: Negative for chest pain, palpitations and leg swelling.    Gastrointestinal: Positive for heartburn. Negative for abdominal pain, blood in stool, constipation, diarrhea, dysphagia, melena and nausea.  Endocrine: Negative for polydipsia.  Genitourinary: Negative for dysuria, frequency, hematuria and urgency.  Musculoskeletal: Negative for back pain, myalgias, muscle weakness and neck pain.  Skin: Negative for rash.  Allergic/Immunologic: Negative for environmental allergies.  Neurological: Negative for dizziness and headaches.  Hematological: Does not bruise/bleed easily.  Psychiatric/Behavioral: Negative for suicidal ideas. The patient is not nervous/anxious.     Patient Active Problem List   Diagnosis Date Noted  . Fracture of scapula 10/31/2012  . Fracture of orbit ([facility]) 10/12/2012  . 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 Fracture of temporal bone ([facility]) 10/12/2012  . Fracture of thoracic spine ([facility]) 10/12/2012    Allergies  Allergen Reactions  . Ibuprofen Anaphylaxis  . Sulfa Antibiotics Other (See Comments)    Past Surgical History:  Procedure Laterality Date  . KNEE ARTHROSCOPY W/ MENISCAL REPAIR  2018  . KNEE ARTHROSCOPY WITH ANTERIOR CRUCIATE LIGAMENT (ACL) REPAIR  2018    Social History   Tobacco Use  . Smoking status: Current Every Day Smoker    Packs/day: 0.50    Years: 10.00    Pack years: 5.00    Types: Cigarettes  . Smokeless tobacco: Former Neurosurgeon    Types: Snuff    Quit date: 2014  Substance Use Topics  . Alcohol use: No    Alcohol/week: 0.0 standard drinks  . Drug use: Yes    Types: Marijuana  Comment: 2 joints/day     Medication list has been reviewed and updated.  Current Meds  Medication Sig  . esomeprazole (NEXIUM) 40 MG capsule TAKE 1 CAPSULE BY MOUTH EVERY DAY  . ondansetron (ZOFRAN) 4 MG tablet Take 1 tablet (4 mg total) by mouth every 8 (eight) hours as needed for nausea or vomiting.  . propranolol (INDERAL) 20 MG tablet Take by mouth.  . SUMAtriptan (IMITREX)  100 MG tablet Take 100 mg by mouth daily as needed.    PHQ 2/9 Scores 09/23/2020 07/04/2019  PHQ - 2 Score 0 0  PHQ- 9 Score 3 2    GAD 7 : Generalized Anxiety Score 09/23/2020  Nervous, Anxious, on Edge 0  Control/stop worrying 0  Worry too much - different things 0  Trouble relaxing 0  Restless 0  Easily annoyed or irritable 0  Afraid - awful might happen 0  Total GAD 7 Score 0  Anxiety Difficulty Not difficult at all    BP Readings from Last 3 Encounters:  09/23/20 124/70  08/09/19 120/68  07/04/19 118/60    Physical Exam Vitals and nursing note reviewed.  HENT:     Head: Normocephalic.     Right Ear: Tympanic membrane, ear canal and external ear normal.     Left Ear: Tympanic membrane, ear canal and external ear normal.     Nose: Nose normal. No congestion or rhinorrhea.     Mouth/Throat:     Mouth: Mucous membranes are moist.  Eyes:     General: No scleral icterus.       Right eye: No discharge.        Left eye: No discharge.     Conjunctiva/sclera: Conjunctivae normal.     Pupils: Pupils are equal, round, and reactive to light.  Neck:     Thyroid: No thyromegaly.     Vascular: No JVD.     Trachea: No tracheal deviation.  Cardiovascular:     Rate and Rhythm: Normal rate and regular rhythm.     Heart sounds: Normal heart sounds. No murmur heard.  No friction rub. No gallop.   Pulmonary:     Effort: No respiratory distress.     Breath sounds: Normal breath sounds. No wheezing, rhonchi or rales.  Abdominal:     General: Bowel sounds are normal.     Palpations: Abdomen is soft. There is no mass.     Tenderness: There is no abdominal tenderness. There is no right CVA tenderness, left CVA tenderness, guarding or rebound.  Musculoskeletal:        General: No tenderness. Normal range of motion.     Cervical back: Normal range of motion and neck supple.  Lymphadenopathy:     Cervical: No cervical adenopathy.  Skin:    General: Skin is warm.     Findings: No  rash.  Neurological:     Mental Status: He is alert and oriented to person, place, and time.     Cranial Nerves: No cranial nerve deficit.     Deep Tendon Reflexes: Reflexes are normal and symmetric.     Wt Readings from Last 3 Encounters:  09/23/20 (!) 306 lb (138.8 kg)  08/09/19 261 lb (118.4 kg)  07/04/19 274 lb (124.3 kg)    BP 124/70   Pulse (!) 124   Ht 6\' 1"  (1.854 m)   Wt (!) 306 lb (138.8 kg)   SpO2 98%   BMI 40.37 kg/m   Assessment and Plan: 1. Gastroesophageal reflux  disease Chronic.  Controlled.  Stable.  Continue Nexium 40 mg once a day. - esomeprazole (NEXIUM) 40 MG capsule; TAKE 1 CAPSULE BY MOUTH EVERY DAY  Dispense: 90 capsule; Refill: 3  2. Low back pain without sciatica, unspecified back pain laterality, unspecified chronicity New onset.  Episodic.  Patient has depending on level of activity occasional episodes of lower back pain which makes difficulty sleeping at night.  Patient has been given cyclobenzaprine 10 mg to use nightly as needed as needed back pain - cyclobenzaprine (FLEXERIL) 10 MG tablet; Take 1 tablet (10 mg total) by mouth 3 (three) times daily as needed for muscle spasms.  Dispense: 30 tablet; Refill: 4

## 2020-10-03 DIAGNOSIS — J9811 Atelectasis: Secondary | ICD-10-CM | POA: Diagnosis not present

## 2020-10-03 DIAGNOSIS — R1011 Right upper quadrant pain: Secondary | ICD-10-CM | POA: Diagnosis not present

## 2020-10-03 DIAGNOSIS — Z20822 Contact with and (suspected) exposure to covid-19: Secondary | ICD-10-CM | POA: Diagnosis not present

## 2020-10-03 DIAGNOSIS — M549 Dorsalgia, unspecified: Secondary | ICD-10-CM | POA: Diagnosis not present

## 2020-10-03 DIAGNOSIS — K429 Umbilical hernia without obstruction or gangrene: Secondary | ICD-10-CM | POA: Diagnosis not present

## 2020-10-03 DIAGNOSIS — R1930 Abdominal rigidity, unspecified site: Secondary | ICD-10-CM | POA: Diagnosis not present

## 2020-10-03 DIAGNOSIS — Z79899 Other long term (current) drug therapy: Secondary | ICD-10-CM | POA: Diagnosis not present

## 2020-10-03 DIAGNOSIS — K760 Fatty (change of) liver, not elsewhere classified: Secondary | ICD-10-CM | POA: Diagnosis not present

## 2020-10-03 DIAGNOSIS — D72829 Elevated white blood cell count, unspecified: Secondary | ICD-10-CM | POA: Diagnosis not present

## 2020-10-03 DIAGNOSIS — F17210 Nicotine dependence, cigarettes, uncomplicated: Secondary | ICD-10-CM | POA: Diagnosis not present

## 2020-10-03 DIAGNOSIS — R918 Other nonspecific abnormal finding of lung field: Secondary | ICD-10-CM | POA: Diagnosis not present

## 2020-10-03 DIAGNOSIS — G8929 Other chronic pain: Secondary | ICD-10-CM | POA: Diagnosis not present

## 2020-10-04 DIAGNOSIS — K760 Fatty (change of) liver, not elsewhere classified: Secondary | ICD-10-CM | POA: Diagnosis not present
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
## 2020-12-02 ENCOUNTER — Other Ambulatory Visit: Payer: Self-pay

## 2020-12-02 ENCOUNTER — Inpatient Hospital Stay: Payer: 59 | Attending: Hematology and Oncology | Admitting: Hematology and Oncology

## 2020-12-02 ENCOUNTER — Encounter: Payer: Self-pay | Admitting: Hematology and Oncology

## 2020-12-02 VITALS — BP 133/84 | HR 84 | Temp 97.4°F | Resp 16 | Wt 310.1 lb

## 2020-12-02 DIAGNOSIS — K219 Gastro-esophageal reflux disease without esophagitis: Secondary | ICD-10-CM | POA: Diagnosis not present

## 2020-12-02 DIAGNOSIS — Z79899 Other long term (current) drug therapy: Secondary | ICD-10-CM | POA: Insufficient documentation

## 2020-12-02 DIAGNOSIS — R1901 Right upper quadrant abdominal swelling, mass and lump: Secondary | ICD-10-CM | POA: Insufficient documentation

## 2020-12-02 DIAGNOSIS — R748 Abnormal levels of other serum enzymes: Secondary | ICD-10-CM | POA: Diagnosis not present

## 2020-12-02 DIAGNOSIS — K760 Fatty (change of) liver, not elsewhere classified: Secondary | ICD-10-CM | POA: Diagnosis not present

## 2020-12-02 DIAGNOSIS — R1013 Epigastric pain: Secondary | ICD-10-CM | POA: Diagnosis not present

## 2020-12-02 DIAGNOSIS — R61 Generalized hyperhidrosis: Secondary | ICD-10-CM | POA: Insufficient documentation

## 2020-12-02 DIAGNOSIS — F17210 Nicotine dependence, cigarettes, uncomplicated: Secondary | ICD-10-CM | POA: Insufficient documentation

## 2020-12-02 DIAGNOSIS — R1011 Right upper quadrant pain: Secondary | ICD-10-CM | POA: Diagnosis not present

## 2020-12-02 DIAGNOSIS — R7989 Other specified abnormal findings of blood chemistry: Secondary | ICD-10-CM | POA: Diagnosis not present

## 2020-12-02 DIAGNOSIS — D72829 Elevated white blood cell count, unspecified: Secondary | ICD-10-CM

## 2020-12-02 LAB — HEPATITIS B SURFACE ANTIGEN: Hepatitis B Surface Ag: NONREACTIVE

## 2020-12-02 LAB — URIC ACID: Uric Acid, Serum: 7.7 mg/dL (ref 3.7–8.6)

## 2020-12-02 LAB — CBC WITH DIFFERENTIAL/PLATELET
Abs Immature Granulocytes: 0.11 10*3/uL — ABNORMAL HIGH (ref 0.00–0.07)
Basophils Absolute: 0.1 10*3/uL (ref 0.0–0.1)
Basophils Relative: 1 %
Eosinophils Absolute: 0.3 10*3/uL (ref 0.0–0.5)
Eosinophils Relative: 2 %
HCT: 46.8 % (ref 39.0–52.0)
Hemoglobin: 16.1 g/dL (ref 13.0–17.0)
Immature Granulocytes: 1 %
Lymphocytes Relative: 22 %
Lymphs Abs: 3.9 10*3/uL (ref 0.7–4.0)
MCH: 29.6 pg (ref 26.0–34.0)
MCHC: 34.4 g/dL (ref 30.0–36.0)
MCV: 86 fL (ref 80.0–100.0)
Monocytes Absolute: 1.2 10*3/uL — ABNORMAL HIGH (ref 0.1–1.0)
Monocytes Relative: 6 %
Neutro Abs: 12.2 10*3/uL — ABNORMAL HIGH (ref 1.7–7.7)
Neutrophils Relative %: 68 %
Platelets: 232 10*3/uL (ref 150–400)
RBC: 5.44 MIL/uL (ref 4.22–5.81)
RDW: 12.4 % (ref 11.5–15.5)
WBC: 17.8 10*3/uL — ABNORMAL HIGH (ref 4.0–10.5)
nRBC: 0 % (ref 0.0–0.2)

## 2020-12-02 LAB — COMPREHENSIVE METABOLIC PANEL
ALT: 92 U/L — ABNORMAL HIGH (ref 0–44)
AST: 42 U/L — ABNORMAL HIGH (ref 15–41)
Albumin: 4.2 g/dL (ref 3.5–5.0)
Alkaline Phosphatase: 96 U/L (ref 38–126)
Anion gap: 14 (ref 5–15)
BUN: 20 mg/dL (ref 6–20)
CO2: 22 mmol/L (ref 22–32)
Calcium: 8.6 mg/dL — ABNORMAL LOW (ref 8.9–10.3)
Chloride: 99 mmol/L (ref 98–111)
Creatinine, Ser: 0.95 mg/dL (ref 0.61–1.24)
GFR, Estimated: >60 mL/min (ref >60)
Glucose, Bld: 81 mg/dL (ref 70–99)
Potassium: 4 mmol/L (ref 3.5–5.1)
Sodium: 135 mmol/L (ref 135–145)
Total Bilirubin: 0.6 mg/dL (ref 0.3–1.2)
Total Protein: 8.1 g/dL (ref 6.5–8.1)

## 2020-12-02 LAB — PATHOLOGIST SMEAR REVIEW

## 2020-12-02 LAB — HEPATITIS C ANTIBODY: HCV Ab: NONREACTIVE

## 2020-12-02 LAB — CK: Total CK: 376 U/L (ref 49–397)

## 2020-12-02 LAB — HEPATITIS B CORE ANTIBODY, TOTAL: Hep B Core Total Ab: NONREACTIVE

## 2020-12-02 LAB — LACTATE DEHYDROGENASE: LDH: 189 U/L (ref 98–192)

## 2020-12-03 LAB — H. PYLORI ANTIBODY, IGG: H Pylori IgG: 0.21 Index Value (ref 0.00–0.79)

## 2020-12-04 ENCOUNTER — Telehealth: Payer: Self-pay | Admitting: Hematology and Oncology

## 2020-12-04 NOTE — Telephone Encounter (Signed)
Patient called on call service requesting to move his 12/23 appointment to later.  Message sent to team for follow-up.

## 2020-12-05 LAB — BCR-ABL1 FISH
Cells Analyzed: 200
Cells Counted: 200

## 2020-12-12 ENCOUNTER — Ambulatory Visit: Payer: 59 | Admitting: Hematology and Oncology
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
## 2020-12-16 ENCOUNTER — Inpatient Hospital Stay (HOSPITAL_BASED_OUTPATIENT_CLINIC_OR_DEPARTMENT_OTHER): Payer: 59 | Admitting: Hematology and Oncology

## 2020-12-16 ENCOUNTER — Encounter: Payer: Self-pay | Admitting: Hematology and Oncology

## 2020-12-16 ENCOUNTER — Other Ambulatory Visit: Payer: Self-pay

## 2020-12-16 VITALS — BP 147/76 | HR 98 | Temp 98.3°F | Resp 16 | Wt 312.0 lb

## 2020-12-16 DIAGNOSIS — D72829 Elevated white blood cell count, unspecified: Secondary | ICD-10-CM | POA: Diagnosis not present

## 2020-12-16 DIAGNOSIS — R748 Abnormal levels of other serum enzymes: Secondary | ICD-10-CM | POA: Insufficient documentation

## 2020-12-16 DIAGNOSIS — R197 Diarrhea, unspecified: Secondary | ICD-10-CM | POA: Diagnosis not present

## 2020-12-16 DIAGNOSIS — R109 Unspecified abdominal pain: Secondary | ICD-10-CM

## 2020-12-16 DIAGNOSIS — R1013 Epigastric pain: Secondary | ICD-10-CM | POA: Insufficient documentation

## 2020-12-16 DIAGNOSIS — R7989 Other specified abnormal findings of blood chemistry: Secondary | ICD-10-CM | POA: Insufficient documentation

## 2020-12-17 ENCOUNTER — Encounter: Payer: Self-pay | Admitting: Gastroenterology

## 2020-12-17 ENCOUNTER — Other Ambulatory Visit: Payer: Self-pay | Admitting: Gastroenterology

## 2020-12-17 ENCOUNTER — Other Ambulatory Visit: Payer: Self-pay

## 2020-12-17 ENCOUNTER — Ambulatory Visit (INDEPENDENT_AMBULATORY_CARE_PROVIDER_SITE_OTHER): Payer: 59 | Admitting: Gastroenterology

## 2020-12-17 VITALS — BP 137/83 | HR 120 | Ht 73.0 in | Wt 315.0 lb

## 2020-12-17 DIAGNOSIS — R197 Diarrhea, unspecified: Secondary | ICD-10-CM | POA: Diagnosis not present

## 2020-12-17 DIAGNOSIS — R1011 Right upper quadrant pain: Secondary | ICD-10-CM

## 2020-12-17 DIAGNOSIS — R109 Unspecified abdominal pain: Secondary | ICD-10-CM | POA: Insufficient documentation

## 2020-12-17 DIAGNOSIS — K760 Fatty (change of) liver, not elsewhere classified: Secondary | ICD-10-CM | POA: Diagnosis not present

## 2020-12-17 MED ORDER — NA SULFATE-K SULFATE-MG SULF 17.5-3.13-1.6 GM/177ML PO SOLN: 1.0000 | Freq: Once | ORAL | 0 refills | Status: AC

## 2020-12-17 NOTE — Progress Notes (Signed)
Resent procedure instructions

## 2020-12-18 LAB — HEPATITIS B SURFACE ANTIBODY,QUALITATIVE: Hep B Surface Ab, Qual: NONREACTIVE

## 2020-12-18 LAB — HEPATITIS A ANTIBODY, TOTAL: hep A Total Ab: POSITIVE — AB

## 2020-12-18 LAB — MITOCHONDRIAL/SMOOTH MUSCLE AB PNL
Mitochondrial Ab: <20 Units (ref 0.0–20.0)
Smooth Muscle Ab: 74 Units — ABNORMAL HIGH (ref 0–19)

## 2020-12-18 LAB — ANTI-MICROSOMAL ANTIBODY LIVER / KIDNEY: LKM1 Ab: 0.7 Units (ref 0.0–20.0)

## 2020-12-18 LAB — IGG: IgG (Immunoglobin G), Serum: 1286 mg/dL (ref 603–1613)

## 2020-12-18 LAB — ANA: ANA Titer 1: NEGATIVE

## 2020-12-18 LAB — CERULOPLASMIN: Ceruloplasmin: 23.3 mg/dL (ref 16.0–31.0)

## 2020-12-18 NOTE — Progress Notes (Signed)
Brendan Ball [address]  [address]  [place], [place] [address]  Main: [phone]  Fax: [phone]   Gastroenterology Consultation  Referring Provider:     Duanne Limerick, MD Primary Care Physician:  Duanne Limerick, MD Reason for Consultation:     Abdominal pain        HPI:    Chief Complaint  Patient presents with  . New Patient (Initial Visit)    Diarrhea and abd pain    Brendan Ball is a 24 y.o. y/o male referred for consultation & management  by Dr. Duanne Limerick, MD.  Patient and wife states patient has had right upper quadrant abdominal pain intermittently for a long time.  They also report abdominal bloating associated with this.  Pain is present with or without meals.  Dull 5 or 10, nonradiating pain.  Associated with nausea but no vomiting.  Patient went to the ER in October for this at [facility].  Ultrasound showed normal gallbladder.  CT also was done and showed diffuse hepatic steatosis, otherwise no significant abnormality.  Patient was found to have mildly elevated transaminases.  Patient has been on Nexium for a long time and states with this his heartburn has completely resolved but he continues to have abdominal pain.  Past Medical History:  Diagnosis Date  . GERD (gastroesophageal reflux disease)     Past Surgical History:  Procedure Laterality Date  . KNEE ARTHROSCOPY W/ MENISCAL REPAIR  2018  . KNEE ARTHROSCOPY WITH ANTERIOR CRUCIATE LIGAMENT (ACL) REPAIR  2018    Prior to Admission medications   Medication Sig Start Date End Date Taking? Authorizing Provider  cyclobenzaprine (FLEXERIL) 10 MG tablet Take 1 tablet (10 mg total) by mouth 3 (three) times daily as needed for muscle spasms. 09/23/20  Yes Duanne Limerick, MD  esomeprazole (NEXIUM) 40 MG capsule TAKE 1 CAPSULE BY MOUTH EVERY DAY 09/23/20  Yes Duanne Limerick, MD  ondansetron (ZOFRAN) 4 MG tablet Take 1 tablet (4 mg total) by mouth every 8 (eight) hours as needed for nausea or  vomiting. 08/09/19  Yes Duanne Limerick, MD  propranolol (INDERAL) 20 MG tablet Take by mouth. 01/01/20 12/31/20 Yes [provider]  SUMAtriptan (IMITREX) 100 MG tablet Take 100 mg by mouth daily as needed. 09/16/20  Yes [provider]    Family History  Problem Relation Age of Onset  . Diabetes Mother   . Hypertension Father   . Cancer Maternal Grandmother   . Diabetes Maternal Grandfather   . Heart disease Paternal Grandfather      Social History   Tobacco Use  . Smoking status: Current Every Day Smoker    Packs/day: 0.50    Years: 10.00    Pack years: 5.00    Types: Cigarettes  . Smokeless tobacco: Former Neurosurgeon    Types: Snuff    Quit date: 2014  Advertising account planner  . Vaping Use: Never used  Substance Use Topics  . Alcohol use: No    Alcohol/week: 0.0 standard drinks  . Drug use: Yes    Types: Marijuana    Comment: 2 joints/day    Allergies as of 12/17/2020 - Review Complete 12/17/2020  Allergen Reaction Noted  . Ibuprofen Anaphylaxis 05/04/2016  . Sulfa antibiotics Other (See Comments) 05/04/2016    Review of Systems:    All systems reviewed and negative except where noted in HPI.   Physical Exam:  BP 137/83 (BP Location: Right Arm, Patient Position: Sitting, Cuff  Size: Large)   Pulse (!) 120   Ht 6\' 1"  (1.854 m)   Wt (!) 315 lb (142.9 kg)   BMI 41.56 kg/m  No LMP for male patient. Psych:  Alert and cooperative. Normal mood and affect. General:   Alert,  Well-developed, well-nourished, pleasant and cooperative in NAD Head:  Normocephalic and atraumatic. Eyes:  Sclera clear, no icterus.   Conjunctiva pink. Ears:  Normal auditory acuity. Nose:  No deformity, discharge, or lesions. Mouth:  No deformity or lesions,oropharynx pink & moist. Neck:  Supple; no masses or thyromegaly. Abdomen:  Normal bowel sounds.  No bruits.  Soft, tender to point palpation right upper quadrant, and non-distended without masses, hepatosplenomegaly or hernias noted.  No  guarding or rebound tenderness.    Msk:  Symmetrical without gross deformities. Good, equal movement & strength bilaterally. Pulses:  Normal pulses noted. Extremities:  No clubbing or edema.  No cyanosis. Neurologic:  Alert and oriented x3;  grossly normal neurologically. Skin:  Intact without significant lesions or rashes. No jaundice. Lymph Nodes:  No significant cervical adenopathy. Psych:  Alert and cooperative. Normal mood and affect.   Labs: CBC    Component Value Date/Time   WBC 17.8 (H) 12/02/2020 1152   RBC 5.44 12/02/2020 1152   HGB 16.1 12/02/2020 1152   HGB 15.7 04/09/2017 1147   HCT 46.8 12/02/2020 1152   HCT 46.3 04/09/2017 1147   PLT 232 12/02/2020 1152   PLT 207 04/09/2017 1147   MCV 86.0 12/02/2020 1152   MCV 88 04/09/2017 1147   MCH 29.6 12/02/2020 1152   MCHC 34.4 12/02/2020 1152   RDW 12.4 12/02/2020 1152   RDW 13.6 04/09/2017 1147   LYMPHSABS 3.9 12/02/2020 1152   LYMPHSABS 3.7 (H) 04/09/2017 1147   MONOABS 1.2 (H) 12/02/2020 1152   EOSABS 0.3 12/02/2020 1152   EOSABS 0.3 04/09/2017 1147   BASOSABS 0.1 12/02/2020 1152   BASOSABS 0.1 04/09/2017 1147   CMP     Component Value Date/Time   NA 135 12/02/2020 1152   NA 137 07/04/2019 1414   K 4.0 12/02/2020 1152   CL 99 12/02/2020 1152   CO2 22 12/02/2020 1152   GLUCOSE 81 12/02/2020 1152   BUN 20 12/02/2020 1152   BUN 23 (H) 07/04/2019 1414   CREATININE 0.95 12/02/2020 1152   CALCIUM 8.6 (L) 12/02/2020 1152   PROT 8.1 12/02/2020 1152   ALBUMIN 4.2 12/02/2020 1152   ALBUMIN 4.9 07/04/2019 1414   AST 42 (H) 12/02/2020 1152   ALT 92 (H) 12/02/2020 1152   ALKPHOS 96 12/02/2020 1152   BILITOT 0.6 12/02/2020 1152   GFRNONAA >60 12/02/2020 1152   GFRAA 108 07/04/2019 1414    Imaging Studies: No results found.  Assessment and Plan:   Brendan Ball is a 24 y.o. y/o male has been referred for abdominal pain and fatty liver  Reproducible pain right upper quadrant is consistent with musculoskeletal  etiology/costochondritis  Ice packs  as needed recommended  However, patient continues to have nausea with meals and some exacerbation of the pain with meals as well that is not responding to PPI therapy.  Therefore, EGD indicated for further evaluation  Patient is also reporting diarrhea every time he eats and colonoscopy would allow 30 to rule out any underlying etiology such as microscopic colitis  Alternative options of conservative management were discussed in detail, including but not limited to medication management, foregoing endoscopic procedures at this time and others.    I have discussed  alternative options, risks & benefits,  which include, but are not limited to, bleeding, infection, perforation,respiratory complication & drug reaction.  The patient agrees with this plan & written consent will be obtained.    Further evaluation of fatty liver also recommended and labs were ordered  Finding of fatty liver on imaging discussed with patient Diet, weight loss, and exercise encouraged along with avoiding hepatotoxic drugs including alcohol Risk of progression to cirrhosis if above measures are not instituted were discussed as well, and patient verbalized understanding  We did discuss that if his work-up is negative, that the underlying etiology of his symptoms may be functional and he verbalized understanding of this as well   Dr Brendan Ball  Speech recognition software was used to dictate the above note.

## 2020-12-23 ENCOUNTER — Other Ambulatory Visit
Admission: RE | Admit: 2020-12-23 | Discharge: 2020-12-23 | Disposition: A | Discharge status: Home / Self Care | Payer: 59 | Source: Ambulatory Visit | Attending: Gastroenterology | Admitting: Gastroenterology

## 2020-12-23 DIAGNOSIS — Z01812 Encounter for preprocedural laboratory examination: Secondary | ICD-10-CM | POA: Insufficient documentation

## 2020-12-23 DIAGNOSIS — Z20822 Contact with and (suspected) exposure to covid-19: Secondary | ICD-10-CM | POA: Insufficient documentation

## 2020-12-24 ENCOUNTER — Other Ambulatory Visit: Payer: Self-pay

## 2020-12-24 ENCOUNTER — Encounter: Payer: Self-pay | Admitting: Gastroenterology

## 2020-12-24 LAB — SARS CORONAVIRUS 2 (TAT 6-24 HRS): SARS Coronavirus 2: NEGATIVE

## 2020-12-24 MED ORDER — PEG 3350-KCL-NA BICARB-NACL 420 G PO SOLR: 4000.0000 mL | Freq: Once | ORAL | 0 refills | Status: AC

## 2020-12-25 ENCOUNTER — Telehealth: Payer: Self-pay

## 2020-12-25 ENCOUNTER — Ambulatory Visit
Admission: RE | Admit: 2020-12-25 | Discharge: 2020-12-25 | Disposition: A | Discharge status: Home / Self Care | Payer: 59 | Attending: Gastroenterology | Admitting: Gastroenterology

## 2020-12-25 ENCOUNTER — Other Ambulatory Visit: Payer: Self-pay

## 2020-12-25 ENCOUNTER — Encounter: Payer: Self-pay | Admitting: Anesthesiology

## 2020-12-25 ENCOUNTER — Encounter
Admission: RE | Disposition: A | Discharge status: Home / Self Care | Payer: Self-pay | Source: Home / Self Care | Attending: Gastroenterology

## 2020-12-25 ENCOUNTER — Encounter: Payer: Self-pay | Admitting: Gastroenterology

## 2020-12-25 DIAGNOSIS — K319 Disease of stomach and duodenum, unspecified: Secondary | ICD-10-CM

## 2020-12-25 DIAGNOSIS — R1011 Right upper quadrant pain: Secondary | ICD-10-CM

## 2020-12-25 DIAGNOSIS — Z79899 Other long term (current) drug therapy: Secondary | ICD-10-CM | POA: Insufficient documentation

## 2020-12-25 DIAGNOSIS — R197 Diarrhea, unspecified: Secondary | ICD-10-CM

## 2020-12-25 DIAGNOSIS — K2100 Gastro-esophageal reflux disease with esophagitis, without bleeding: Secondary | ICD-10-CM | POA: Insufficient documentation

## 2020-12-25 DIAGNOSIS — K529 Noninfective gastroenteritis and colitis, unspecified: Secondary | ICD-10-CM | POA: Diagnosis not present

## 2020-12-25 DIAGNOSIS — Z886 Allergy status to analgesic agent status: Secondary | ICD-10-CM | POA: Insufficient documentation

## 2020-12-25 DIAGNOSIS — Z882 Allergy status to sulfonamides status: Secondary | ICD-10-CM | POA: Diagnosis not present

## 2020-12-25 DIAGNOSIS — K2289 Other specified disease of esophagus: Secondary | ICD-10-CM | POA: Insufficient documentation

## 2020-12-25 DIAGNOSIS — R112 Nausea with vomiting, unspecified: Secondary | ICD-10-CM | POA: Diagnosis not present

## 2020-12-25 DIAGNOSIS — K3189 Other diseases of stomach and duodenum: Secondary | ICD-10-CM | POA: Diagnosis not present

## 2020-12-25 DIAGNOSIS — F17210 Nicotine dependence, cigarettes, uncomplicated: Secondary | ICD-10-CM | POA: Insufficient documentation

## 2020-12-25 DIAGNOSIS — K6389 Other specified diseases of intestine: Secondary | ICD-10-CM | POA: Diagnosis not present

## 2020-12-25 DIAGNOSIS — R109 Unspecified abdominal pain: Secondary | ICD-10-CM | POA: Diagnosis not present

## 2020-12-25 HISTORY — DX: Fracture of unspecified part of scapula, unspecified shoulder, initial encounter for closed fracture: S42.109A

## 2020-12-25 HISTORY — DX: Fracture of orbit, unspecified, initial encounter for closed fracture: S02.85XA

## 2020-12-25 HISTORY — DX: Headache, unspecified: R51.9

## 2020-12-25 HISTORY — DX: Other fracture of base of skull, initial encounter for closed fracture: S02.19XA

## 2020-12-25 HISTORY — PX: COLONOSCOPY WITH PROPOFOL: SHX5780

## 2020-12-25 HISTORY — DX: Unspecified fracture of unspecified thoracic vertebra, initial encounter for closed fracture: S22.009A

## 2020-12-25 HISTORY — PX: ESOPHAGOGASTRODUODENOSCOPY (EGD) WITH PROPOFOL: SHX5813

## 2020-12-25 HISTORY — DX: Nontraumatic subarachnoid hemorrhage, unspecified: I60.9

## 2020-12-25 SURGERY — COLONOSCOPY WITH PROPOFOL
Anesthesia: General

## 2020-12-25 MED ORDER — PROPOFOL 10 MG/ML IV BOLUS
INTRAVENOUS | Status: AC
  Filled 2020-12-25: qty 20

## 2020-12-25 MED ORDER — MIDAZOLAM HCL 2 MG/2ML IJ SOLN
INTRAMUSCULAR | Status: DC | PRN
  Administered 2020-12-25: 1 mg via INTRAVENOUS

## 2020-12-25 MED ORDER — PROPOFOL 500 MG/50ML IV EMUL
INTRAVENOUS | Status: AC
  Filled 2020-12-25: qty 50

## 2020-12-25 MED ORDER — PROPOFOL 10 MG/ML IV BOLUS
INTRAVENOUS | Status: DC | PRN
  Administered 2020-12-25 (×2): 30 mg via INTRAVENOUS
  Administered 2020-12-25 (×2): 50 mg via INTRAVENOUS

## 2020-12-25 MED ORDER — MIDAZOLAM HCL 2 MG/2ML IJ SOLN
INTRAMUSCULAR | Status: AC
  Filled 2020-12-25: qty 2

## 2020-12-25 MED ORDER — LIDOCAINE HCL (PF) 2 % IJ SOLN
INTRAMUSCULAR | Status: AC
  Filled 2020-12-25: qty 5

## 2020-12-25 MED ORDER — PROPOFOL 500 MG/50ML IV EMUL
INTRAVENOUS | Status: DC | PRN
  Administered 2020-12-25: 180 ug/kg/min via INTRAVENOUS

## 2020-12-25 MED ORDER — SODIUM CHLORIDE 0.9 % IV SOLN: INTRAVENOUS | Status: DC

## 2020-12-25 NOTE — Anesthesia Postprocedure Evaluation (Signed)
Anesthesia Post Note  Patient: Brendan Ball  Procedure(s) Performed: COLONOSCOPY WITH PROPOFOL (N/A ) ESOPHAGOGASTRODUODENOSCOPY (EGD) WITH PROPOFOL (N/A )  Patient location during evaluation: Endoscopy Anesthesia Type: General Level of consciousness: awake and alert Pain management: pain level controlled Vital Signs Assessment: post-procedure vital signs reviewed and stable Respiratory status: spontaneous breathing, nonlabored ventilation, respiratory function stable and patient connected to nasal cannula oxygen Cardiovascular status: blood pressure returned to baseline and stable Postop Assessment: no apparent nausea or vomiting Anesthetic complications: no   No complications documented.   Last Vitals:  Vitals:   12/25/20 1203 12/25/20 1223  BP: 107/64 122/80  Pulse: 82   Resp: (!) 29   Temp: (!) 36.1 C   SpO2: 94%     Last Pain:  Vitals:   12/25/20 1223  TempSrc:   PainSc: 0-No pain                 Cleda Mccreedy Nick Armel

## 2020-12-25 NOTE — H&P (Signed)
Melodie Bouillon, MD [address], [place], [place], [address] [address], [place], [place], [address] Phone: [phone]  Fax: [phone]  Primary Care Physician:  Duanne Limerick, MD   Pre-Procedure History & Physical: HPI:  Brendan Ball is a 25 y.o. male is here for a colonoscopy and EGD.   Past Medical History:  Diagnosis Date  . GERD (gastroesophageal reflux disease)   . Headache   . Orbital fracture (HCC)   . SAH (subarachnoid hemorrhage) (HCC)   . Scapula fracture   . Temporal bone fracture (HCC)   . Thoracic spine fracture [facility])     Past Surgical History:  Procedure Laterality Date  . KNEE ARTHROSCOPY W/ MENISCAL REPAIR  2018  . KNEE ARTHROSCOPY WITH ANTERIOR CRUCIATE LIGAMENT (ACL) REPAIR  2018    Prior to Admission medications   Medication Sig Start Date End Date Taking? Authorizing Provider  cyclobenzaprine (FLEXERIL) 10 MG tablet Take 1 tablet (10 mg total) by mouth 3 (three) times daily as needed for muscle spasms. 09/23/20   Duanne Limerick, MD  esomeprazole (NEXIUM) 40 MG capsule TAKE 1 CAPSULE BY MOUTH EVERY DAY 09/23/20   Duanne Limerick, MD  ondansetron (ZOFRAN) 4 MG tablet Take 1 tablet (4 mg total) by mouth every 8 (eight) hours as needed for nausea or vomiting. 08/09/19   Duanne Limerick, MD  propranolol (INDERAL) 20 MG tablet Take by mouth. 01/01/20 12/31/20  [provider]  SUMAtriptan (IMITREX) 100 MG tablet Take 100 mg by mouth daily as needed. 09/16/20   [provider]    Allergies as of 12/17/2020 - Review Complete 12/17/2020  Allergen Reaction Noted  . Ibuprofen Anaphylaxis 05/04/2016  . Sulfa antibiotics Other (See Comments) 05/04/2016    Family History  Problem Relation Age of Onset  . Diabetes Mother   . Hypertension Father   . Cancer Maternal Grandmother   . Diabetes Maternal Grandfather   . Heart disease Paternal Grandfather     Social History   Socioeconomic History  . Marital status:  Single    Spouse name: Not on file  . Number of children: Not on file  . Years of education: Not on file  . Highest education level: Not on file  Occupational History  . Not on file  Tobacco Use  . Smoking status: Current Every Day Smoker    Packs/day: 0.50    Years: 10.00    Pack years: 5.00    Types: Cigarettes  . Smokeless tobacco: Former Neurosurgeon    Types: Snuff    Quit date: 2014  Advertising account planner  . Vaping Use: Never used  Substance and Sexual Activity  . Alcohol use: No    Alcohol/week: 0.0 standard drinks    Comment: occasional  . Drug use: Yes    Types: Marijuana    Comment: 2 joints/day  . Sexual activity: Yes  Other Topics Concern  . Not on file  Social History Narrative  . Not on file   Social Determinants of Health   Financial Resource Strain: Not on file  Food Insecurity: Not on file  Transportation Needs: Not on file  Physical Activity: Not on file  Stress: Not on file  Social Connections: Not on file  Intimate Partner Violence: Not on file    Review of Systems: See HPI, otherwise negative ROS  Physical Exam: BP 138/85   Pulse 85   Temp 98.4 F (36.9 C) (Skin)   Resp 18   Ht 6'  1" (1.854 m)   Wt 136.1 kg   SpO2 100%   BMI 39.58 kg/m  General:   Alert,  pleasant and cooperative in NAD Head:  Normocephalic and atraumatic. Neck:  Supple; no masses or thyromegaly. Lungs:  Clear throughout to auscultation, normal respiratory effort.    Heart:  +S1, +S2, Regular rate and rhythm, No edema. Abdomen:  Soft, nontender and nondistended. Normal bowel sounds, without guarding, and without rebound.   Neurologic:  Alert and  oriented x4;  grossly normal neurologically.  Impression/Plan: Brendan Ball is here for a colonoscopy to be performed for diarrhea and EGD for Abdominal pain  Risks, benefits, limitations, and alternatives regarding the procedures have been reviewed with the patient.  Questions have been answered.  All parties agreeable.   Pasty Spillers, MD  12/25/2020, 10:33 AM

## 2020-12-25 NOTE — Anesthesia Procedure Notes (Signed)
Date/Time: 12/25/2020 11:10 AM Performed by: Henrietta Hoover, CRNA Pre-anesthesia Checklist: Patient identified, Emergency Drugs available, Suction available, Patient being monitored and Timeout performed Patient Re-evaluated:Patient Re-evaluated prior to induction Oxygen Delivery Method: Nasal cannula Placement Confirmation: positive ETCO2

## 2020-12-25 NOTE — Telephone Encounter (Signed)
Called patient to let him know the below information. Patient agreed on getting his Hepatitis B vaccination from his PCP, doing a repeat anti-smooth muscle antibody labs in 3 months and follow up with Dr. Maximino Greenland to discuss his procedure results. I told patient that I would send myself a message to call him and remind him to come in to our office or any [facility] location to have his labs repeated. Patient understood and had no further questions.

## 2020-12-25 NOTE — Op Note (Signed)
[facility] Gastroenterology Patient Name: Brendan Ball Procedure Date: 12/25/2020 11:12 AM MRN: [id number] Account #: [id number] Date of Birth: 02/12/1996 Admit Type: Outpatient Age: 25 Room: [facility] ENDO ROOM 3 Gender: Male Note Status: Finalized Procedure:             Colonoscopy Indications:           Clinically significant diarrhea of unexplained origin Providers:             Edlyn Rosenburg B. Maximino Greenland MD, MD Medicines:             Monitored Anesthesia Care Complications:         No immediate complications. Procedure:             Pre-Anesthesia Assessment:                        - Prior to the procedure, a History and Physical was                         performed, and patient medications, allergies and                         sensitivities were reviewed. The patient's tolerance                         of previous anesthesia was reviewed.                        - The risks and benefits of the procedure and the                         sedation options and risks were discussed with the                         patient. All questions were answered and informed                         consent was obtained.                        - Patient identification and proposed procedure were                         verified prior to the procedure by the physician, the                         nurse, the anesthetist and the technician. The                         procedure was verified in the pre-procedure area in                         the procedure room in the endoscopy suite.                        - ASA Grade Assessment: II - A patient with mild                         systemic disease.                        -  After reviewing the risks and benefits, the patient                         was deemed in satisfactory condition to undergo the                         procedure.                        After obtaining informed consent, the colonoscope was                         passed  under direct vision. Throughout the procedure,                         the patient's blood pressure, pulse, and oxygen                         saturations were monitored continuously. The                         Colonoscope was introduced through the anus and                         advanced to the the terminal ileum. The colonoscopy                         was performed with ease. The patient tolerated the                         procedure well. The quality of the bowel preparation                         was fair. Findings:      The perianal and digital rectal examinations were normal.      A patchy area of mucosa in the terminal ileum was mildly erythematous.       Biopsies were taken with a cold forceps for histology.      The rectum, sigmoid colon, descending colon, transverse colon, ascending       colon and cecum appeared normal. Biopsies for histology were taken with       a cold forceps from the entire colon for evaluation of microscopic       colitis.      The retroflexed view of the distal rectum and anal verge was normal and       showed no anal or rectal abnormalities. Impression:            - Preparation of the colon was fair.                        - Erythematous mucosa in the terminal ileum. Biopsied.                        - The rectum, sigmoid colon, descending colon,                         transverse colon, ascending colon and cecum are  normal. Biopsied.                        - The distal rectum and anal verge are normal on                         retroflexion view. Recommendation:        - Discharge patient to home.                        - Resume previous diet.                        - Continue present medications.                        - Return to primary care physician as previously                         scheduled.                        - The findings and recommendations were discussed with                         the patient.                         - The findings and recommendations were discussed with                         the patient's family. Procedure Code(s):     --- Professional ---                        [id number], Colonoscopy, flexible; with biopsy, single or                         multiple Diagnosis Code(s):     --- Professional ---                        K63.89, Other specified diseases of intestine                        R19.7, Diarrhea, unspecified CPT copyright 2019 American Medical Association. All rights reserved. The codes documented in this report are preliminary and upon coder review may  be revised to meet current compliance requirements.  Melodie Bouillon, MD Michel Bickers B. Maximino Greenland MD, MD 12/25/2020 12:06:54 PM This report has been signed electronically. Number of Addenda: 0 Note Initiated On: 12/25/2020 11:12 AM Scope Withdrawal Time: 0 hours 10 minutes 36 seconds  Total Procedure Duration: 0 hours 15 minutes 19 seconds  Estimated Blood Loss:  Estimated blood loss: none.      [facility]

## 2020-12-25 NOTE — Progress Notes (Signed)
Patient does have a tongue ring that is in place and states "I would rather not remove because it is hard to get back in place".  Ledell Peoples, CRNA stated that it was ok to leave in for procedure as it does not affect the airway.  At the conclusion of the procedure, it was confirmed by myself, Ledell Peoples, CRNA and Dr. Maximino Greenland that the tongue ring was still in place and that the top and bottom pieces of the tongue ring were in place as well.

## 2020-12-25 NOTE — Transfer of Care (Signed)
Immediate Anesthesia Transfer of Care Note  Patient: Brendan Ball  Procedure(s) Performed: COLONOSCOPY WITH PROPOFOL (N/A ) ESOPHAGOGASTRODUODENOSCOPY (EGD) WITH PROPOFOL (N/A )  Patient Location: PACU  Anesthesia Type:General  Level of Consciousness: sedated  Airway & Oxygen Therapy: Patient Spontanous Breathing and Patient connected to face mask oxygen  Post-op Assessment: Report given to RN and Post -op Vital signs reviewed and stable  Post vital signs: Reviewed and stable  Last Vitals:  Vitals Value Taken Time  BP    Temp    Pulse    Resp    SpO2      Last Pain:  Vitals:   12/25/20 1203  TempSrc: Temporal  PainSc: 0-No pain         Complications: No complications documented.

## 2020-12-25 NOTE — Telephone Encounter (Signed)
-----   Message from Pasty Spillers, MD sent at 12/24/2020  1:57 PM EST ----- Kandis Cocking, please see my my chart comment below to this patient.  Please schedule follow-up appointment in 8 weeks

## 2020-12-25 NOTE — Op Note (Signed)
[facility] Gastroenterology Patient Name: Jamonta Goerner Procedure Date: 12/25/2020 11:12 AM MRN: [id number] Account #: [id number] Date of Birth: 1996-01-08 Admit Type: Outpatient Age: 25 Room: [facility] ENDO ROOM 3 Gender: Male Note Status: Finalized Procedure:             Upper GI endoscopy Indications:           Abdominal pain, Diarrhea, Nausea with vomiting Providers:             Varnita B. Maximino Greenland MD, MD Medicines:             Monitored Anesthesia Care Complications:         No immediate complications. Procedure:             Pre-Anesthesia Assessment:                        - Prior to the procedure, a History and Physical was                         performed, and patient medications, allergies and                         sensitivities were reviewed. The patient's tolerance                         of previous anesthesia was reviewed.                        - The risks and benefits of the procedure and the                         sedation options and risks were discussed with the                         patient. All questions were answered and informed                         consent was obtained.                        - Patient identification and proposed procedure were                         verified prior to the procedure by the physician, the                         nurse, the anesthesiologist, the anesthetist and the                         technician. The procedure was verified in the                         procedure room.                        - ASA Grade Assessment: II - A patient with mild                         systemic disease.  After obtaining informed consent, the endoscope was                         passed under direct vision. Throughout the procedure,                         the patient's blood pressure, pulse, and oxygen                         saturations were monitored continuously. The Endoscope                          was introduced through the mouth, and advanced to the                         second part of duodenum. The upper GI endoscopy was                         accomplished with ease. The patient tolerated the                         procedure well. Findings:      Islands of salmon-colored mucosa were present. Mucosa was biopsied with       a cold forceps for histology.      LA Grade A (one or more mucosal breaks less than 5 mm, not extending       between tops of 2 mucosal folds) esophagitis was found in the distal       esophagus.      The exam of the esophagus was otherwise normal.      Patchy mildly erythematous mucosa without bleeding was found in the       gastric antrum. Biopsies were taken with a cold forceps for histology.       Biopsies were obtained in the gastric body, at the incisura and in the       gastric antrum with cold forceps for histology.      The exam of the stomach was otherwise normal.      The duodenal bulb, second portion of the duodenum and examined duodenum       were normal. Biopsies for histology were taken with a cold forceps for       evaluation of celiac disease. Impression:            - Salmon-colored mucosa suspicious for short-segment                         Barrett's esophagus. Biopsied.                        - LA Grade A reflux esophagitis.                        - Erythematous mucosa in the antrum. Biopsied.                        - Normal duodenal bulb, second portion of the duodenum                         and examined duodenum. Biopsied.                        -  Biopsies were obtained in the gastric body, at the                         incisura and in the gastric antrum. Recommendation:        - Await pathology results.                        - Discharge patient to home (with escort).                        - Advance diet as tolerated.                        - Continue present medications.                        - Patient has a contact number  available for                         emergencies. The signs and symptoms of potential                         delayed complications were discussed with the patient.                         Return to normal activities tomorrow. Written                         discharge instructions were provided to the patient.                        - Discharge patient to home (with escort).                        - The findings and recommendations were discussed with                         the patient.                        - The findings and recommendations were discussed with                         the patient's family.                        - Take prescribed proton pump inhibitor or H2 blocker                         (antacid) medications 30 - 60 minutes before meals.                        - Follow an antireflux regimen. Procedure Code(s):     --- Professional ---                        [id number], Esophagogastroduodenoscopy, flexible,                         transoral; with biopsy, single or multiple Diagnosis Code(s):     ---  Professional ---                        K22.8, Other specified diseases of esophagus                        K21.00, Gastro-esophageal reflux disease with                         esophagitis, without bleeding                        K31.89, Other diseases of stomach and duodenum                        R10.9, Unspecified abdominal pain                        R19.7, Diarrhea, unspecified                        R11.2, Nausea with vomiting, unspecified CPT copyright 2019 American Medical Association. All rights reserved. The codes documented in this report are preliminary and upon coder review may  be revised to meet current compliance requirements.  [name], MD Margretta Sidle B. Bonna Gains MD, MD 12/25/2020 11:38:04 AM This report has been signed electronically. Number of Addenda: 0 Note Initiated On: 12/25/2020 11:12 AM Estimated Blood Loss:  Estimated blood loss: none.       [facility]

## 2020-12-25 NOTE — Anesthesia Preprocedure Evaluation (Signed)
Anesthesia Evaluation  Patient identified by MRN, date of birth, ID band Patient awake    Reviewed: Allergy & Precautions, H&P , NPO status , Patient's Chart, lab work & pertinent test results  History of Anesthesia Complications Negative for: history of anesthetic complications  Airway Mallampati: III  TM Distance: >3 FB Neck ROM: full    Dental  (+) Chipped, Poor Dentition   Pulmonary neg shortness of breath, Current Smoker and Patient abstained from smoking.,    Pulmonary exam normal        Cardiovascular Exercise Tolerance: Good (-) angina(-) Past MI negative cardio ROS Normal cardiovascular exam     Neuro/Psych  Headaches, negative psych ROS   GI/Hepatic Neg liver ROS, GERD  Medicated and Controlled,  Endo/Other  Morbid obesity  Renal/GU negative Renal ROS  negative genitourinary   Musculoskeletal   Abdominal   Peds  Hematology negative hematology ROS (+)   Anesthesia Other Findings Past Medical History: No date: GERD (gastroesophageal reflux disease) No date: Headache No date: Orbital fracture (HCC) No date: SAH (subarachnoid hemorrhage) (HCC) No date: Scapula fracture No date: Temporal bone fracture (HCC) No date: Thoracic spine fracture [facility])  Past Surgical History: 2018: KNEE ARTHROSCOPY W/ MENISCAL REPAIR 2018: KNEE ARTHROSCOPY WITH ANTERIOR CRUCIATE LIGAMENT (ACL) REPAIR      Reproductive/Obstetrics negative OB ROS                             Anesthesia Physical Anesthesia Plan  ASA: III  Anesthesia Plan: General   Post-op Pain Management:    Induction: Intravenous  PONV Risk Score and Plan: Propofol infusion and TIVA  Airway Management Planned: Natural Airway and Nasal Cannula  Additional Equipment:   Intra-op Plan:   Post-operative Plan:   Informed Consent: I have reviewed the patients History and Physical, chart, labs and discussed the procedure  including the risks, benefits and alternatives for the proposed anesthesia with the patient or authorized representative who has indicated his/her understanding and acceptance.     Dental Advisory Given  Plan Discussed with: Anesthesiologist, CRNA and Surgeon  Anesthesia Plan Comments: (Patient consented for risks of anesthesia including but not limited to:  - adverse reactions to medications - risk of airway placement if required - damage to eyes, teeth, lips or other oral mucosa - nerve damage due to positioning  - sore throat or hoarseness - Damage to heart, brain, nerves, lungs, other parts of body or loss of life  Patient voiced understanding.)        Anesthesia Quick Evaluation

## 2020-12-26 ENCOUNTER — Telehealth: Payer: Self-pay | Admitting: Gastroenterology

## 2020-12-26 LAB — SURGICAL PATHOLOGY

## 2020-12-26 NOTE — Progress Notes (Signed)
Pt. C/0 SORE THROAT MILD. INSTRUCTED TO TAKE TYLENOL AND GARGLE WITH WARM SALT WATER. ALSO C/O ABDOMINAL PAIN LEVEL 5 ALL NIGHT. PT STATES THAT STARTED  AT 600PM YESTERDAY. PT INSTRUCTED TO CALL OFFICE  TODAY. I TOLD DR Felipa Emory.

## 2020-12-26 NOTE — Telephone Encounter (Signed)
Pt having pain in middle of stomach following procedure from yesterday, pt describes pain as an 8 on 1-10 scale. Please call to advise

## 2020-12-30 NOTE — Telephone Encounter (Signed)
Called patient back and he stated that he was feeling better. Patient stated that he was having some abdominal cramping but he knew that it would be normal to feel that way as told when he was discharged. Patient denied abdominal pain, nausea, vomiting, diarrhea, constipation, or rectal bleeding. I told him to give [place] a call in case he developed new symptoms. Patient understood and had no further questions.

## 2021-01-08 ENCOUNTER — Encounter: Payer: Self-pay | Admitting: Gastroenterology

## 2021-01-08 ENCOUNTER — Telehealth (INDEPENDENT_AMBULATORY_CARE_PROVIDER_SITE_OTHER): Payer: 59 | Admitting: Gastroenterology

## 2021-01-08 DIAGNOSIS — K209 Esophagitis, unspecified without bleeding: Secondary | ICD-10-CM | POA: Diagnosis not present

## 2021-01-08 MED ORDER — PANTOPRAZOLE SODIUM 40 MG PO TBEC: 40.0000 mg | DELAYED_RELEASE_TABLET | Freq: Two times a day (BID) | ORAL | 0 refills | Status: DC

## 2021-01-09 NOTE — Progress Notes (Signed)
Brendan Bouillon, MD [address]  [address]  [place], [place] [address]  Main: [phone]  Fax: [phone]   Primary Care Physician: Duanne Limerick, MD  Virtual Visit via Telephone Note  I connected with patient on 01/09/21 at  3:00 PM EST by telephone and verified that I am speaking with the correct person using two identifiers.   I discussed the limitations, risks, security and privacy concerns of performing an evaluation and management service by telephone and the availability of in person appointments. I also discussed with the patient that there may be a patient responsible charge related to this service. The patient expressed understanding and agreed to proceed.  Location of Patient: Home Location of Provider: Home Persons involved: Patient and provider only during the visit (nursing staff and front desk staff was involved in communicating with the patient prior to the appointment, reviewing medications and checking them in)   History of Present Illness: Chief Complaint  Patient presents with  . RUQ abdominal pain     HPI: Brendan Ball is a 25 y.o. male here for follow-up of abdominal pain.  Patient states his symptoms continue as before and he she also has intermittent loose stools after eating.  Patient was found to have esophagitis on his upper endoscopy and was already on his current dose of Nexium at that time.  He does take BC powder intermittently and biopsies from terminal ileum show acute ileitis with no chronic changes in the colon or ileum.  Current Outpatient Medications  Medication Sig Dispense Refill  . cyclobenzaprine (FLEXERIL) 10 MG tablet Take 1 tablet (10 mg total) by mouth 3 (three) times daily as needed for muscle spasms. (Patient taking differently: Take 10 mg by mouth as needed for muscle spasms.) 30 tablet 4  . ondansetron (ZOFRAN) 4 MG tablet Take 1 tablet (4 mg total) by mouth every 8 (eight) hours as needed for nausea or vomiting. 20  tablet 0  . pantoprazole (PROTONIX) 40 MG tablet Take 1 tablet (40 mg total) by mouth 2 (two) times daily for 28 days. 56 tablet 0  . SUMAtriptan (IMITREX) 100 MG tablet Take 100 mg by mouth daily as needed.     No current facility-administered medications for this visit.    Allergies as of 01/08/2021 - Review Complete 01/08/2021  Allergen Reaction Noted  . Ibuprofen Anaphylaxis 05/04/2016  . Sulfa antibiotics Other (See Comments) 05/04/2016    Review of Systems:    All systems reviewed and negative except where noted in HPI.   Observations/Objective:  Labs: CMP     Component Value Date/Time   NA 135 12/02/2020 1152   NA 137 07/04/2019 1414   K 4.0 12/02/2020 1152   CL 99 12/02/2020 1152   CO2 22 12/02/2020 1152   GLUCOSE 81 12/02/2020 1152   BUN 20 12/02/2020 1152   BUN 23 (H) 07/04/2019 1414   CREATININE 0.95 12/02/2020 1152   CALCIUM 8.6 (L) 12/02/2020 1152   PROT 8.1 12/02/2020 1152   ALBUMIN 4.2 12/02/2020 1152   ALBUMIN 4.9 07/04/2019 1414   AST 42 (H) 12/02/2020 1152   ALT 92 (H) 12/02/2020 1152   ALKPHOS 96 12/02/2020 1152   BILITOT 0.6 12/02/2020 1152   GFRNONAA >60 12/02/2020 1152   GFRAA 108 07/04/2019 1414   Lab Results  Component Value Date   WBC 17.8 (H) 12/02/2020   HGB 16.1 12/02/2020   HCT 46.8 12/02/2020   MCV 86.0 12/02/2020   PLT 232 12/02/2020  Imaging Studies: No results found.  Assessment and Plan:   Brendan Ball is a 25 y.o. y/o male here for follow-up of abdominal pain  Assessment and Plan: Due to esophagitis present despite Nexium use, changed to Protonix 40 twice daily for 30 days  Patient educated extensively on acid reflux lifestyle modification, including buying a bed wedge, not eating 3 hrs before bedtime, diet modifications, and handout given for the same.   (Risks of PPI use were discussed with patient including bone loss, C. Diff diarrhea, pneumonia, infections, CKD, electrolyte abnormalities.  Pt. Verbalizes  understanding and chooses to continue the medication.)  Patient advised to abstain from NSAIDs and use Tylenol no more than 3000 mg a day and no more than 1000 mg at 1 time, and use it occasionally and he verbalized understanding  He states he only needs to use it for headaches about once a month  Repeat anti-smooth muscle antibody in 2 to 3 months as it was previously mildly elevated  Finding of fatty liver on imaging discussed with patient Diet, weight loss, and exercise encouraged along with avoiding hepatotoxic drugs including alcohol Risk of progression to cirrhosis if above measures are not instituted were discussed as well, and patient verbalized understanding   Follow Up Instructions:    I discussed the assessment and treatment plan with the patient. The patient was provided an opportunity to ask questions and all were answered. The patient agreed with the plan and demonstrated an understanding of the instructions.   The patient was advised to call back or seek an in-person evaluation if the symptoms worsen or if the condition fails to improve as anticipated.  I provided 12 minutes of non-face-to-face time during this encounter. Additional time was spent in reviewing patient's chart, placing orders etc.   Pasty Spillers, MD  Speech recognition software was used to dictate this note.
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
## 2021-01-21 ENCOUNTER — Telehealth: Payer: Self-pay

## 2021-01-21 ENCOUNTER — Encounter: Payer: Self-pay | Admitting: Hematology and Oncology

## 2021-01-21 ENCOUNTER — Other Ambulatory Visit: Payer: 59

## 2021-01-21 ENCOUNTER — Other Ambulatory Visit: Payer: Self-pay

## 2021-01-21 ENCOUNTER — Inpatient Hospital Stay: Payer: 59 | Attending: Hematology and Oncology | Admitting: Hematology and Oncology

## 2021-01-21 DIAGNOSIS — K2100 Gastro-esophageal reflux disease with esophagitis, without bleeding: Secondary | ICD-10-CM | POA: Diagnosis not present

## 2021-01-21 DIAGNOSIS — Z79899 Other long term (current) drug therapy: Secondary | ICD-10-CM | POA: Insufficient documentation

## 2021-01-21 DIAGNOSIS — F17210 Nicotine dependence, cigarettes, uncomplicated: Secondary | ICD-10-CM | POA: Insufficient documentation

## 2021-01-21 DIAGNOSIS — R519 Headache, unspecified: Secondary | ICD-10-CM | POA: Insufficient documentation

## 2021-01-21 DIAGNOSIS — R1011 Right upper quadrant pain: Secondary | ICD-10-CM | POA: Diagnosis not present

## 2021-01-21 DIAGNOSIS — D72829 Elevated white blood cell count, unspecified: Secondary | ICD-10-CM

## 2021-01-21 LAB — CBC WITH DIFFERENTIAL/PLATELET
Abs Immature Granulocytes: 0.11 10*3/uL — ABNORMAL HIGH (ref 0.00–0.07)
Basophils Absolute: 0.1 10*3/uL (ref 0.0–0.1)
Basophils Relative: 1 %
Eosinophils Absolute: 0.3 10*3/uL (ref 0.0–0.5)
Eosinophils Relative: 2 %
HCT: 44.9 % (ref 39.0–52.0)
Hemoglobin: 15.2 g/dL (ref 13.0–17.0)
Immature Granulocytes: 1 %
Lymphocytes Relative: 25 %
Lymphs Abs: 4 10*3/uL (ref 0.7–4.0)
MCH: 29.1 pg (ref 26.0–34.0)
MCHC: 33.9 g/dL (ref 30.0–36.0)
MCV: 85.9 fL (ref 80.0–100.0)
Monocytes Absolute: 1.1 10*3/uL — ABNORMAL HIGH (ref 0.1–1.0)
Monocytes Relative: 7 %
Neutro Abs: 10.1 10*3/uL — ABNORMAL HIGH (ref 1.7–7.7)
Neutrophils Relative %: 64 %
Platelets: 227 10*3/uL (ref 150–400)
RBC: 5.23 MIL/uL (ref 4.22–5.81)
RDW: 12.4 % (ref 11.5–15.5)
WBC: 15.7 10*3/uL — ABNORMAL HIGH (ref 4.0–10.5)
nRBC: 0 % (ref 0.0–0.2)

## 2021-01-21 NOTE — Telephone Encounter (Signed)
I called patient and stated to him that based on his lab report that is wbc was better and per dr.corcroan to continue plan discussed in clinic.

## 2021-03-25 ENCOUNTER — Other Ambulatory Visit: Payer: Self-pay

## 2021-04-02 ENCOUNTER — Telehealth: Payer: Self-pay | Admitting: Gastroenterology

## 2021-04-02 NOTE — Telephone Encounter (Signed)
  The following medication renewals have been denied:  - pantoprazole (PROTONIX) 40 MG tablet  If you have any questions about your prescription, please send a message to your doctor.   Patient has appointment 05/14/21 for Rx refill.  Patient asks if Dr T can send in enough of RX until the appointment .

## 2021-04-02 NOTE — Telephone Encounter (Signed)
Can patient get refills? If so, how many?

## 2021-04-04 ENCOUNTER — Other Ambulatory Visit: Payer: Self-pay | Admitting: Gastroenterology

## 2021-04-04 MED ORDER — PANTOPRAZOLE SODIUM 40 MG PO TBEC: 40.0000 mg | DELAYED_RELEASE_TABLET | Freq: Two times a day (BID) | ORAL | 1 refills | Status: DC

## 2021-04-11 MED ORDER — PANTOPRAZOLE SODIUM 40 MG PO TBEC: 40.0000 mg | DELAYED_RELEASE_TABLET | Freq: Two times a day (BID) | ORAL | 0 refills | Status: DC

## 2021-04-11 NOTE — Addendum Note (Signed)
Addended by: Adela Ports on: 04/11/2021 12:33 PM   Modules accepted: Orders

## 2021-05-14 ENCOUNTER — Other Ambulatory Visit: Payer: Self-pay

## 2021-05-14 ENCOUNTER — Ambulatory Visit (INDEPENDENT_AMBULATORY_CARE_PROVIDER_SITE_OTHER): Payer: 59 | Admitting: Gastroenterology

## 2021-05-14 VITALS — BP 154/80 | HR 105 | Wt 300.0 lb

## 2021-05-14 DIAGNOSIS — R109 Unspecified abdominal pain: Secondary | ICD-10-CM

## 2021-05-14 DIAGNOSIS — K760 Fatty (change of) liver, not elsewhere classified: Secondary | ICD-10-CM

## 2021-05-14 MED ORDER — PANTOPRAZOLE SODIUM 20 MG PO TBEC: DELAYED_RELEASE_TABLET | ORAL | 0 refills | Status: DC

## 2021-05-14 NOTE — Patient Instructions (Addendum)
Your Barium Swallow test has been scheduled for May 31st at 10:00AM, ou will need to arrive at 9:30AM and have NOTHING to eat or drink after 6:30AM, location is [facility] entrance  Protonix prescription has been sent to your pharmacy.  You will take 2 tablets (40 mg)  daily for 30 days then 1 tablet daily thereafter

## 2021-05-14 NOTE — Progress Notes (Signed)
Brendan Bouillon, MD [address]  [address]  [place], [place] [address]  Main: [phone]  Fax: [phone]   Primary Care Physician: Duanne Limerick, MD   Chief Complaint  Patient presents with  . Gastroesophageal Reflux    Pt report intermittent burning and abd pain  . Medication Refill    HPI: Brendan Ball is a 25 y.o. male here for follow-up of abdominal pain and reflux.  Patient is on PPI twice daily, which she states has helped with his appetite and heartburn.  Continues to have intermittent episodes of right upper quadrant pain that occur even without meals and states it can last all night or for an hour and he is unable to do anything during these episodes.  He also reports 3 soft bowel movements daily, with no blood.  He reports that the symptoms sometimes feel like there is gas stuck in the epigastric region  Previous EGD showed esophagitis and he was already on Nexium when the EGD was done.  And therefore medication was changed to Protonix twice daily  Current Outpatient Medications  Medication Sig Dispense Refill  . cyclobenzaprine (FLEXERIL) 10 MG tablet Take 1 tablet (10 mg total) by mouth 3 (three) times daily as needed for muscle spasms. (Patient taking differently: Take 10 mg by mouth as needed for muscle spasms.) 30 tablet 4  . ondansetron (ZOFRAN) 4 MG tablet Take 1 tablet (4 mg total) by mouth every 8 (eight) hours as needed for nausea or vomiting. 20 tablet 0  . pantoprazole (PROTONIX) 20 MG tablet Take 2 tablets (40 mg)  daily for 30 days then 1 tablet daily thereafter 120 tablet 0  . SUMAtriptan (IMITREX) 100 MG tablet Take 100 mg by mouth daily as needed.     No current facility-administered medications for this visit.    Allergies as of 05/14/2021 - Review Complete 05/14/2021  Allergen Reaction Noted  . Ibuprofen Anaphylaxis 05/04/2016  . Sulfa antibiotics Other (See Comments) 05/04/2016    ROS:  General: Negative for anorexia, weight  loss, fever, chills, fatigue, weakness. ENT: Negative for hoarseness, difficulty swallowing , nasal congestion. CV: Negative for chest pain, angina, palpitations, dyspnea on exertion, peripheral edema.  Respiratory: Negative for dyspnea at rest, dyspnea on exertion, cough, sputum, wheezing.  GI: See history of present illness. GU:  Negative for dysuria, hematuria, urinary incontinence, urinary frequency, nocturnal urination.  Endo: Negative for unusual weight change.    Physical Examination:   BP (!) 154/80   Pulse (!) 105   Wt 300 lb (136.1 kg)   BMI 39.58 kg/m   General: Well-nourished, well-developed in no acute distress.  Eyes: No icterus. Conjunctivae pink. Mouth: Oropharyngeal mucosa moist and pink , no lesions erythema or exudate. Neck: Supple, Trachea midline Abdomen: Bowel sounds are normal, nontender, nondistended, no hepatosplenomegaly or masses, no abdominal bruits or hernia , no rebound or guarding.   Extremities: No lower extremity edema. No clubbing or deformities. Neuro: Alert and oriented x 3.  Grossly intact. Skin: Warm and dry, no jaundice.   Psych: Alert and cooperative, normal mood and affect.   Labs: CMP     Component Value Date/Time   NA 135 12/02/2020 1152   NA 137 07/04/2019 1414   K 4.0 12/02/2020 1152   CL 99 12/02/2020 1152   CO2 22 12/02/2020 1152   GLUCOSE 81 12/02/2020 1152   BUN 20 12/02/2020 1152   BUN 23 (H) 07/04/2019 1414   CREATININE 0.95 12/02/2020 1152  CALCIUM 8.6 (L) 12/02/2020 1152   PROT 8.1 12/02/2020 1152   ALBUMIN 4.2 12/02/2020 1152   ALBUMIN 4.9 07/04/2019 1414   AST 42 (H) 12/02/2020 1152   ALT 92 (H) 12/02/2020 1152   ALKPHOS 96 12/02/2020 1152   BILITOT 0.6 12/02/2020 1152   GFRNONAA >60 12/02/2020 1152   GFRAA 108 07/04/2019 1414   Lab Results  Component Value Date   WBC 15.7 (H) 01/21/2021   HGB 15.2 01/21/2021   HCT 44.9 01/21/2021   MCV 85.9 01/21/2021   PLT 227 01/21/2021    Imaging Studies: No  results found.  Assessment and Plan:   [name] is a 25 y.o. y/o male here for follow-up of abdominal pain and GERD  I will obtain an esophagram to evaluate for any small hiatal hernia given his symptoms that he is describing a feeling like acid stuck in epigastric region as small hiatal hernias can sometimes not be visible on upper endoscopy.  If present, can consider hiatal hernia surgery referral.  I did discuss with him that he likely has a component of functional symptoms  Weight loss, through diet and exercise was recommended both of the above and due to fatty liver seen on imaging  Follow-up with PCP to work on stressors, as that would help with functional symptoms  Decrease Protonix to once a day and then further to 20 mg once daily as long as symptoms remain controlled and then discontinue if no change in symptoms.  This may help with his loose stools as well  Finding of fatty liver on imaging discussed with patient Diet, weight loss, and exercise encouraged along with avoiding hepatotoxic drugs including alcohol Risk of progression to cirrhosis if above measures are not instituted were discussed as well, and patient verbalized understanding  Previous work-up for fatty liver has revealed that he is not immune to hepatitis B and immunization with PCP has been recommended  (Risks of PPI use were discussed with patient including bone loss, C. Diff diarrhea, pneumonia, infections, CKD, electrolyte abnormalities.  Pt. Verbalizes understanding and chooses to continue the medication.)  Patient educated extensively on acid reflux lifestyle modification, including buying a bed wedge, not eating 3 hrs before bedtime, diet modifications, and handout given for the same.     Dr Brendan Ball

## 2021-05-20 ENCOUNTER — Other Ambulatory Visit: Payer: Self-pay

## 2021-05-20 ENCOUNTER — Ambulatory Visit
Admission: RE | Admit: 2021-05-20 | Discharge: 2021-05-20 | Disposition: A | Discharge status: Home / Self Care | Payer: 59 | Source: Ambulatory Visit | Attending: Gastroenterology | Admitting: Gastroenterology

## 2021-05-20 DIAGNOSIS — R109 Unspecified abdominal pain: Secondary | ICD-10-CM | POA: Diagnosis not present

## 2021-05-30 ENCOUNTER — Other Ambulatory Visit: Payer: Self-pay | Admitting: Gastroenterology

## 2021-05-30 DIAGNOSIS — A09 Infectious gastroenteritis and colitis, unspecified: Secondary | ICD-10-CM

## 2021-06-02 NOTE — Telephone Encounter (Signed)
Pasty Spillers, MD  Atticus Wedin, Brendia Sacks, CMA I just spoke with the patient over the phone.  He states he does not have any more fever today.  He states he has not had any further diarrhea today.  He states he has much improved.  If symptoms come back I have advised him to give [place] a call back.  I have placed work-up for infectious stool etiology in his chart and if symptoms return, he will need to get that done.  And he is agreeable to that.  However, if symptoms return over the weekend, haveencouraged him to call our answering service and he verbalized understanding.

## 2021-06-17 ENCOUNTER — Telehealth: Payer: Self-pay | Admitting: Gastroenterology

## 2021-06-17 MED ORDER — PANTOPRAZOLE SODIUM 20 MG PO TBEC: 20.0000 mg | DELAYED_RELEASE_TABLET | Freq: Every day | ORAL | 0 refills | Status: DC

## 2021-06-17 NOTE — Telephone Encounter (Signed)
Please call pharmacy [phone]- [phone] to confirm dosage on Pantoprazole

## 2021-07-22 ENCOUNTER — Other Ambulatory Visit: Payer: 59

## 2021-07-22 ENCOUNTER — Ambulatory Visit: Payer: 59 | Admitting: Internal Medicine

## 2021-08-01 ENCOUNTER — Other Ambulatory Visit: Payer: Self-pay

## 2021-08-01 DIAGNOSIS — D72829 Elevated white blood cell count, unspecified: Secondary | ICD-10-CM

## 2021-08-05 ENCOUNTER — Encounter: Payer: Self-pay | Admitting: Internal Medicine

## 2021-08-05 ENCOUNTER — Inpatient Hospital Stay: Payer: 59 | Attending: Internal Medicine

## 2021-08-05 ENCOUNTER — Other Ambulatory Visit: Payer: Self-pay

## 2021-08-05 ENCOUNTER — Inpatient Hospital Stay (HOSPITAL_BASED_OUTPATIENT_CLINIC_OR_DEPARTMENT_OTHER): Payer: 59 | Admitting: Internal Medicine

## 2021-08-05 DIAGNOSIS — E669 Obesity, unspecified: Secondary | ICD-10-CM | POA: Diagnosis not present

## 2021-08-05 DIAGNOSIS — D72829 Elevated white blood cell count, unspecified: Secondary | ICD-10-CM | POA: Insufficient documentation

## 2021-08-05 DIAGNOSIS — Z79899 Other long term (current) drug therapy: Secondary | ICD-10-CM | POA: Insufficient documentation

## 2021-08-05 DIAGNOSIS — F17210 Nicotine dependence, cigarettes, uncomplicated: Secondary | ICD-10-CM | POA: Insufficient documentation

## 2021-08-05 DIAGNOSIS — D72828 Other elevated white blood cell count: Secondary | ICD-10-CM | POA: Insufficient documentation

## 2021-08-05 LAB — CBC WITH DIFFERENTIAL/PLATELET
Abs Immature Granulocytes: 0.1 10*3/uL — ABNORMAL HIGH (ref 0.00–0.07)
Basophils Absolute: 0.1 10*3/uL (ref 0.0–0.1)
Basophils Relative: 1 %
Eosinophils Absolute: 0.6 10*3/uL — ABNORMAL HIGH (ref 0.0–0.5)
Eosinophils Relative: 4 %
HCT: 44.4 % (ref 39.0–52.0)
Hemoglobin: 15.6 g/dL (ref 13.0–17.0)
Immature Granulocytes: 1 %
Lymphocytes Relative: 26 %
Lymphs Abs: 4.1 10*3/uL — ABNORMAL HIGH (ref 0.7–4.0)
MCH: 30.2 pg (ref 26.0–34.0)
MCHC: 35.1 g/dL (ref 30.0–36.0)
MCV: 85.9 fL (ref 80.0–100.0)
Monocytes Absolute: 1.1 10*3/uL — ABNORMAL HIGH (ref 0.1–1.0)
Monocytes Relative: 7 %
Neutro Abs: 9.6 10*3/uL — ABNORMAL HIGH (ref 1.7–7.7)
Neutrophils Relative %: 61 %
Platelets: 222 10*3/uL (ref 150–400)
RBC: 5.17 MIL/uL (ref 4.22–5.81)
RDW: 12.9 % (ref 11.5–15.5)
WBC: 15.5 10*3/uL — ABNORMAL HIGH (ref 4.0–10.5)
nRBC: 0 % (ref 0.0–0.2)

## 2021-08-05 NOTE — Progress Notes (Signed)
[facility] CONSULT NOTE  Patient Care Team: Duanne Limerick, MD as PCP - General (Family Medicine) Denny Peon, MD as Referring Physician (Rheumatology) Rosey Bath, MD (Inactive) as Referring Physician (Hematology and Oncology)  CHIEF COMPLAINTS/PURPOSE OF CONSULTATION: Leukocytosis  #  Oncology History   No history exists.     HISTORY OF PRESENTING ILLNESS:  Brendan Ball 25 y.o.  male with a history of leukocytosis is here for follow-up. Patient denies any fevers or chills.  Denies any nausea vomiting abdominal pain.  Any night sweats.  No weight loss.   Review of Systems  Constitutional:  Negative for chills, diaphoresis, fever, malaise/fatigue and weight loss.  HENT:  Negative for nosebleeds and sore throat.   Eyes:  Negative for double vision.  Respiratory:  Negative for cough, hemoptysis, sputum production, shortness of breath and wheezing.   Cardiovascular:  Negative for chest pain, palpitations, orthopnea and leg swelling.  Gastrointestinal:  Negative for abdominal pain, blood in stool, constipation, diarrhea, heartburn, melena, nausea and vomiting.  Genitourinary:  Negative for dysuria, frequency and urgency.  Musculoskeletal:  Negative for back pain and joint pain.  Skin: Negative.  Negative for itching and rash.  Neurological:  Negative for dizziness, tingling, focal weakness, weakness and headaches.  Endo/Heme/Allergies:  Does not bruise/bleed easily.  Psychiatric/Behavioral:  Negative for depression. The patient is not nervous/anxious and does not have insomnia.     MEDICAL HISTORY:  Past Medical History:  Diagnosis Date   GERD (gastroesophageal reflux disease)    Headache    Orbital fracture (HCC)    SAH (subarachnoid hemorrhage) (HCC)    Scapula fracture    Temporal bone fracture (HCC)    Thoracic spine fracture (HCC)     SURGICAL HISTORY: Past Surgical History:  Procedure Laterality Date   COLONOSCOPY WITH PROPOFOL  N/A 12/25/2020   Procedure: COLONOSCOPY WITH PROPOFOL;  Surgeon: Pasty Spillers, MD;  Location: [facility] ENDOSCOPY;  Service: Endoscopy;  Laterality: N/A;   ESOPHAGOGASTRODUODENOSCOPY (EGD) WITH PROPOFOL N/A 12/25/2020   Procedure: ESOPHAGOGASTRODUODENOSCOPY (EGD) WITH PROPOFOL;  Surgeon: Pasty Spillers, MD;  Location: [facility] ENDOSCOPY;  Service: Endoscopy;  Laterality: N/A;   KNEE ARTHROSCOPY W/ MENISCAL REPAIR  2018   KNEE ARTHROSCOPY WITH ANTERIOR CRUCIATE LIGAMENT (ACL) REPAIR  2018    SOCIAL HISTORY: Social History   Socioeconomic History   Marital status: Married    Spouse name: Not on file   Number of children: Not on file   Years of education: Not on file   Highest education level: Not on file  Occupational History   Not on file  Tobacco Use   Smoking status: Every Day    Packs/day: 0.50    Years: 10.00    Pack years: 5.00    Types: Cigarettes   Smokeless tobacco: Former    Types: Snuff    Quit date: 2014  Vaping Use   Vaping Use: Never used  Substance and Sexual Activity   Alcohol use: No    Alcohol/week: 0.0 standard drinks    Comment: occasional   Drug use: Yes    Types: Marijuana    Comment: 2 joints/day   Sexual activity: Yes  Other Topics Concern   Not on file  Social History Narrative   Not on file   Social Determinants of Health   Financial Resource Strain: Not on file  Food Insecurity: Not on file  Transportation Needs: Not on file  Physical Activity: Not on file  Stress: Not on file  Social Connections: Not on file  Intimate Partner Violence: Not on file    FAMILY HISTORY: Family History  Problem Relation Age of Onset   Diabetes Mother    Hypertension Father    Cancer Maternal Grandmother    Diabetes Maternal Grandfather    Heart disease Paternal Grandfather     ALLERGIES:  is allergic to ibuprofen and sulfa antibiotics.  MEDICATIONS:  Current Outpatient Medications  Medication Sig Dispense Refill   pantoprazole (PROTONIX) 20 MG  tablet Take 1 tablet (20 mg total) by mouth daily. 90 tablet 0   cyclobenzaprine (FLEXERIL) 10 MG tablet Take 1 tablet (10 mg total) by mouth 3 (three) times daily as needed for muscle spasms. (Patient not taking: Reported on 08/05/2021) 30 tablet 4   ondansetron (ZOFRAN) 4 MG tablet Take 1 tablet (4 mg total) by mouth every 8 (eight) hours as needed for nausea or vomiting. (Patient not taking: Reported on 08/05/2021) 20 tablet 0   pantoprazole (PROTONIX) 40 MG tablet Take by mouth. (Patient not taking: Reported on 08/05/2021)     SUMAtriptan (IMITREX) 100 MG tablet Take 100 mg by mouth daily as needed. (Patient not taking: Reported on 08/05/2021)     No current facility-administered medications for this visit.      Marland Kitchen  PHYSICAL EXAMINATION: ECOG PERFORMANCE STATUS: 0 - Asymptomatic  Vitals:   08/05/21 1448  BP: (!) 139/95  Pulse: 96  Resp: 20  Temp: 98.7 F (37.1 C)  SpO2: 99%   Filed Weights   08/05/21 1448  Weight: (!) 306 lb 10.6 oz (139.1 kg)    Physical Exam Vitals and nursing note reviewed.  Constitutional:      Comments: Alone.  Ambulating independently.  HENT:     Head: Normocephalic and atraumatic.     Mouth/Throat:     Pharynx: Oropharynx is clear.  Eyes:     Extraocular Movements: Extraocular movements intact.     Pupils: Pupils are equal, round, and reactive to light.  Cardiovascular:     Rate and Rhythm: Normal rate and regular rhythm.  Pulmonary:     Comments: Decreased breath sounds bilaterally.  Abdominal:     Palpations: Abdomen is soft.  Musculoskeletal:        General: Normal range of motion.     Cervical back: Normal range of motion.  Skin:    General: Skin is warm.  Neurological:     General: No focal deficit present.     Mental Status: He is alert and oriented to person, place, and time.  Psychiatric:        Behavior: Behavior normal.        Judgment: Judgment normal.    LABORATORY DATA:  I have reviewed the data as listed Lab Results   Component Value Date   WBC 15.5 (H) 08/05/2021   HGB 15.6 08/05/2021   HCT 44.4 08/05/2021   MCV 85.9 08/05/2021   PLT 222 08/05/2021   Recent Labs    12/02/20 1152  NA 135  K 4.0  CL 99  CO2 22  GLUCOSE 81  BUN 20  CREATININE 0.95  CALCIUM 8.6*  GFRNONAA >60  PROT 8.1  ALBUMIN 4.2  AST 42*  ALT 92*  ALKPHOS 96  BILITOT 0.6    RADIOGRAPHIC STUDIES: I have personally reviewed the radiological images as listed and agreed with the findings in the report. No results found.  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   #Etiology most likely reactive/inflammatory-defer to PCP for further work-up/follow-up  #Obesity: Discussed importance of healthy weight/and weight loss.  Strongly recommend eating more green leafy vegetables and cutting down processed food/ carbohydrates.  Instead increasing whole grains / protein in the diet.   #Since patient is clinically stable from hematology I think is reasonable for the patient to follow-up with PCP/can follow-up with [place] as needed.  Patient comfortable with the plan; to call us if any questions or concerns in the interim.  All questions were answered. The patient knows to call the clinic with any problems, questions or concerns.    Earna Coder, MD 08/12/2021 8:27 AM

## 2021-08-05 NOTE — Assessment & Plan Note (Addendum)
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   #Etiology most likely reactive/inflammatory-defer to PCP for further work-up/follow-up  #Obesity: Discussed importance of healthy weight/and weight loss.  Strongly recommend eating more green leafy vegetables and cutting down processed food/ carbohydrates.  Instead increasing whole grains / protein in the diet.   #Since patient is clinically stable from hematology I think is reasonable for the patient to follow-up with PCP/can follow-up with [place] as needed.  Patient comfortable with the plan; to call us if any questions or concerns in the interim.

## 2021-08-14 ENCOUNTER — Ambulatory Visit (INDEPENDENT_AMBULATORY_CARE_PROVIDER_SITE_OTHER): Payer: 59 | Admitting: Gastroenterology

## 2021-08-14 ENCOUNTER — Other Ambulatory Visit: Payer: Self-pay

## 2021-08-14 ENCOUNTER — Encounter: Payer: Self-pay | Admitting: Gastroenterology

## 2021-08-14 VITALS — BP 135/86 | HR 103 | Temp 98.2°F | Ht 73.0 in | Wt 305.4 lb

## 2021-08-14 DIAGNOSIS — K760 Fatty (change of) liver, not elsewhere classified: Secondary | ICD-10-CM | POA: Diagnosis not present

## 2021-08-14 DIAGNOSIS — K2100 Gastro-esophageal reflux disease with esophagitis, without bleeding: Secondary | ICD-10-CM | POA: Diagnosis not present

## 2021-08-14 MED ORDER — PANTOPRAZOLE SODIUM 20 MG PO TBEC: DELAYED_RELEASE_TABLET | ORAL | 0 refills | Status: DC

## 2021-08-14 NOTE — Progress Notes (Signed)
Brendan Antigua, MD [address]  [place]  [name], [name] [address]  Main: [phone]  Fax: [phone]   Primary Care Physician: [name], MD   Chief Complaint  Patient presents with   Follow-up    3 month f/u... Pt reports increase in GERD Sx and regurgitation since adjustment to medication    HPI: Brendan Ball is a 25 y.o. male here for follow-up of reflux.  Patient has noted increase in heartburn symptoms, since decreasing PPI to once daily.  Is having heartburn symptoms every day, throughout the day.  Sleeps on a flat bed, and not on a reclined surface.  No dysphagia, nausea or vomiting.  No weight loss.  Patient reports 2 formed bowel movements a day   ROS: All ROS reviewed and negative except as per HPI   Past Medical History:  Diagnosis Date   GERD (gastroesophageal reflux disease)    Headache    Orbital fracture (HCC)    SAH (subarachnoid hemorrhage) (HCC)    Scapula fracture    Temporal bone fracture (HCC)    Thoracic spine fracture (HCC)     Past Surgical History:  Procedure Laterality Date   COLONOSCOPY WITH PROPOFOL N/A 12/25/2020   Procedure: COLONOSCOPY WITH PROPOFOL;  Surgeon: [name], MD;  Location: [facility] ENDOSCOPY;  Service: Endoscopy;  Laterality: N/A;   ESOPHAGOGASTRODUODENOSCOPY (EGD) WITH PROPOFOL N/A 12/25/2020   Procedure: ESOPHAGOGASTRODUODENOSCOPY (EGD) WITH PROPOFOL;  Surgeon: [name], MD;  Location: [facility] ENDOSCOPY;  Service: Endoscopy;  Laterality: N/A;   KNEE ARTHROSCOPY W/ MENISCAL REPAIR  2018   KNEE ARTHROSCOPY WITH ANTERIOR CRUCIATE LIGAMENT (ACL) REPAIR  2018    Prior to Admission medications   Medication Sig Start Date End Date Taking? Authorizing Provider  cyclobenzaprine (FLEXERIL) 10 MG tablet Take 1 tablet (10 mg total) by mouth 3 (three) times daily as needed for muscle spasms. 09/23/20  Yes [name], MD  ondansetron (ZOFRAN) 4 MG tablet Take 1 tablet (4 mg total) by mouth every 8  (eight) hours as needed for nausea or vomiting. 08/09/19  Yes [name], MD  SUMAtriptan (IMITREX) 100 MG tablet Take 100 mg by mouth daily as needed. 09/16/20  Yes [provider]  pantoprazole (PROTONIX) 20 MG tablet Take 1 tablet (20 mg total) by mouth 2 (two) times daily for 60 days, THEN 1 tablet (20 mg total) daily. 08/14/21 11/12/21  [name], MD    Family History  Problem Relation Age of Onset   Diabetes Mother    Hypertension Father    Cancer Maternal Grandmother    Diabetes Maternal Grandfather    Heart disease Paternal Grandfather      Social History   Tobacco Use   Smoking status: Every Day    Packs/day: 0.50    Years: 10.00    Pack years: 5.00    Types: Cigarettes   Smokeless tobacco: Former    Types: Snuff    Quit date: 2014  Vaping Use   Vaping Use: Never used  Substance Use Topics   Alcohol use: No    Alcohol/week: 0.0 standard drinks    Comment: occasional   Drug use: Yes    Types: Marijuana    Comment: 2 joints/day    Allergies as of 08/14/2021 - Review Complete 08/14/2021  Allergen Reaction Noted   Ibuprofen Anaphylaxis 05/04/2016   Sulfa antibiotics Other (See Comments) 05/04/2016    Physical Examination:  Constitutional: General:   Alert,  Well-developed,  well-nourished, pleasant and cooperative in NAD BP 135/86   Pulse (!) 103   Temp 98.2 F (36.8 C) (Oral)   Ht 6' 1"  (1.854 m)   Wt (!) 305 lb 6.4 oz (138.5 kg)   BMI 40.29 kg/m   Respiratory: Normal respiratory effort  Gastrointestinal:  Soft, non-tender and non-distended without masses, hepatosplenomegaly or hernias noted.  No guarding or rebound tenderness.     Cardiac: No clubbing or edema.  No cyanosis. Normal posterior tibial pedal pulses noted.  Psych:  Alert and cooperative. Normal mood and affect.  Musculoskeletal:  Normal gait. Head normocephalic, atraumatic. Symmetrical without gross deformities. 5/5 Lower extremity strength bilaterally.  Skin:  Warm. Intact without significant lesions or rashes. No jaundice.  Neck: Supple, trachea midline  Lymph: No cervical lymphadenopathy  Psych:  Alert and oriented x3, Alert and cooperative. Normal mood and affect.  Labs: CMP     Component Value Date/Time   NA 135 12/02/2020 1152   NA 137 07/04/2019 1414   K 4.0 12/02/2020 1152   CL 99 12/02/2020 1152   CO2 22 12/02/2020 1152   GLUCOSE 81 12/02/2020 1152   BUN 20 12/02/2020 1152   BUN 23 (H) 07/04/2019 1414   CREATININE 0.95 12/02/2020 1152   CALCIUM 8.6 (L) 12/02/2020 1152   PROT 8.1 12/02/2020 1152   ALBUMIN 4.2 12/02/2020 1152   ALBUMIN 4.9 07/04/2019 1414   AST 42 (H) 12/02/2020 1152   ALT 92 (H) 12/02/2020 1152   ALKPHOS 96 12/02/2020 1152   BILITOT 0.6 12/02/2020 1152   GFRNONAA >60 12/02/2020 1152   GFRAA 108 07/04/2019 1414   Lab Results  Component Value Date   WBC 15.5 (H) 08/05/2021   HGB 15.6 08/05/2021   HCT 44.4 08/05/2021   MCV 85.9 08/05/2021   PLT 222 08/05/2021   April 2022 labs AST 49 ALT 83 Alk phos normal, bilirubin normal  CBC shows normal hemoglobin of 15.7, platelets 226  INR 1  Imaging Studies:   Assessment and Plan:   Brendan Ball is a 25 y.o. y/o male here for follow-up of reflux, and history of fatty liver  Due to recurrence in symptoms, will increase PPI to twice daily for 2 months as it was controlling his symptoms well before decreasing the dose  (Risks of PPI use were discussed with patient including bone loss, C. Diff diarrhea, pneumonia, infections, CKD, electrolyte abnormalities.  Pt. Verbalizes understanding and chooses to continue the medication.)  Patient educated extensively on acid reflux lifestyle modification, including using a bed wedge, not eating 3 hrs before bedtime, diet modifications, and handout given for the same.   His EGD did show features of salmon-colored mucosa, with biopsies negative for intestinal metaplasia.  These changes do suggest that patient has had  reflux to start causing these early changes, therefore treatment at this time indicated given ongoing symptoms on a daily basis  Weight loss encouraged as well as this would help with his symptoms long-term and allow him to decrease PPI therapy long-term as well.  In addition, he has history of fatty liver and this was previously discussed with him as well and weight loss encouraged.  Given ongoing symptoms, pH study also discussed and patient is agreeable.  We will place referral to [facility] for the same  Most recent labs from April 2022 reviewed as above, and consistent with his history of fatty liver, with no evidence of cirrhosis, with normal bilirubin, platelets, INR   Dr Brendan Ball

## 2021-08-14 NOTE — Addendum Note (Signed)
Addended by: Roena Malady on: 08/14/2021 04:57 PM   Modules accepted: Orders

## 2021-08-14 NOTE — Patient Instructions (Signed)
Please use a bed wedge at night  Food Choices for Gastroesophageal Reflux Disease, Adult When you have gastroesophageal reflux disease (GERD), the foods you eat and your eating habits are very important. Choosing the right foods can help ease your discomfort. Think about working with a food expert (dietitian) to help you make good choices. What are tips for following this plan? Reading food labels Look for foods that are low in saturated fat. Foods that may help with your symptoms include: Foods that have less than 5% of daily value (DV) of fat. Foods that have 0 grams of trans fat. Cooking Do not fry your food. Cook your food by baking, steaming, grilling, or broiling. These are all methods that do not need a lot of fat for cooking. To add flavor, try to use herbs that are low in spice and acidity. Meal planning  Choose healthy foods that are low in fat, such as: Fruits and vegetables. Whole grains. Low-fat dairy products. Lean meats, fish, and poultry. Eat small meals often instead of eating 3 large meals each day. Eat your meals slowly in a place where you are relaxed. Avoid bending over or lying down until 2-3 hours after eating. Limit high-fat foods such as fatty meats or fried foods. Limit your intake of fatty foods, such as oils, butter, and shortening. Avoid the following as told by your doctor: Foods that cause symptoms. These may be different for different people. Keep a food diary to keep track of foods that cause symptoms. Alcohol. Drinking a lot of liquid with meals. Eating meals during the 2-3 hours before bed.  Lifestyle Stay at a healthy weight. Ask your doctor what weight is healthy for you. If you need to lose weight, work with your doctor to do so safely. Exercise for at least 30 minutes on 5 or more days each week, or as told by your doctor. Wear loose-fitting clothes. Do not smoke or use any products that contain nicotine or tobacco. If you need help quitting,  ask your doctor. Sleep with the head of your bed higher than your feet. Use a wedge under the mattress or blocks under the bed frame to raise the head of the bed. Chew sugar-free gum after meals. What foods should eat?  Eat a healthy, well-balanced diet of fruits, vegetables, whole grains, low-fatdairy products, lean meats, fish, and poultry. Each person is different. Foods that may cause symptoms in one person may not cause any symptoms inanother person. Work with your doctor to find foods that are safe for you. The items listed above may not be a complete list of what you can eat and drink. Contact a food expert for more options. What foods should I avoid? Limiting some of these foods may help in managing the symptoms of GERD. Everyone is different. Talk with a food expert or your doctor to help you findthe exact foods to avoid, if any. Fruits Any fruits prepared with added fat. Any fruits that cause symptoms. For some people, this may include citrus fruits, such as oranges, grapefruit, pineapple,and lemons. Vegetables Deep-fried vegetables. [place] fries. Any vegetables prepared with added fat. Any vegetables that cause symptoms. For some people, this may include tomatoesand tomato products, chili peppers, onions and garlic, and horseradish. Grains Pastries or quick breads with added fat. Meats and other proteins High-fat meats, such as fatty beef or pork, hot dogs, ribs, ham, sausage, salami, and bacon. Fried meat or protein, including fried fish and friedchicken. Nuts and nut butters, in large  amounts. Dairy Whole milk and chocolate milk. Sour cream. Cream. Ice cream. Cream cheese.Milkshakes. Fats and oils Butter. Margarine. Shortening. Ghee. Beverages Coffee and tea, with or without caffeine. Carbonated beverages. Sodas. Energy drinks. Fruit juice made with acidic fruits, such as orange or grapefruit.Tomato juice. Alcoholic drinks. Sweets and desserts Chocolate and cocoa.  Donuts. Seasonings and condiments Pepper. Peppermint and spearmint. Added salt. Any condiments, herbs, or seasonings that cause symptoms. For some people, this may include curry, hotsauce, or vinegar-based salad dressings. The items listed above may not be a complete list of what you should not eat and drink. Contact a food expert for more options. Questions to ask your doctor Diet and lifestyle changes are often the first steps that are taken to manage symptoms of GERD. If diet and lifestyle changes do not help, talk with yourdoctor about taking medicines. Where to find more information International [employer] for Gastrointestinal Disorders: aboutgerd.org Summary When you have GERD, food and lifestyle choices are very important in easing your symptoms. Eat small meals often instead of 3 large meals a day. Eat your meals slowly and in a place where you are relaxed. Avoid bending over or lying down until 2-3 hours after eating. Limit high-fat foods such as fatty meats or fried foods. This information is not intended to replace advice given to you by your health care provider. Make sure you discuss any questions you have with your healthcare provider. Document Revised: 06/17/2020 Document Reviewed: 06/17/2020 Elsevier Patient Education  2022 [employer].

## 2021-10-22 ENCOUNTER — Other Ambulatory Visit: Payer: Self-pay

## 2021-10-22 ENCOUNTER — Ambulatory Visit (INDEPENDENT_AMBULATORY_CARE_PROVIDER_SITE_OTHER): Payer: 59 | Admitting: Gastroenterology

## 2021-10-22 VITALS — BP 143/81 | HR 106 | Temp 98.5°F | Wt 319.8 lb

## 2021-10-22 DIAGNOSIS — R748 Abnormal levels of other serum enzymes: Secondary | ICD-10-CM | POA: Diagnosis not present

## 2021-10-22 DIAGNOSIS — K219 Gastro-esophageal reflux disease without esophagitis: Secondary | ICD-10-CM | POA: Diagnosis not present

## 2021-10-22 DIAGNOSIS — R1011 Right upper quadrant pain: Secondary | ICD-10-CM | POA: Diagnosis not present

## 2021-10-22 DIAGNOSIS — K760 Fatty (change of) liver, not elsewhere classified: Secondary | ICD-10-CM | POA: Diagnosis not present

## 2021-10-22 MED ORDER — FAMOTIDINE 20 MG PO TABS: 20.0000 mg | ORAL_TABLET | Freq: Every day | ORAL | 0 refills | Status: DC

## 2021-10-22 NOTE — Progress Notes (Signed)
Melodie Bouillon, MD [address]  [address]  [place], [place] [address]  Main: [phone]  Fax: [phone]   Primary Care Physician: Duanne Limerick, MD   Chief Complaint  Patient presents with   Follow-up    Pt report he has not had any improvement and has had days the symptoms lasted all day    HPI: Brendan Ball is a 25 y.o. male here for follow-up and is reporting breakthrough symptoms of reflux every 2 days.  Is also reporting right upper quadrant abdominal pain, with association with nausea, and but no vomiting.  No weight loss.  States has tried to sleep on a bed wedge.  Symptoms usually occur early in the morning, but sometimes during the afternoon as well.  Is taking Protonix 20 mg twice a day.  Has not heard from [facility] in regard to his pH study.   ROS: All ROS reviewed and negative except as per HPI   Past Medical History:  Diagnosis Date   GERD (gastroesophageal reflux disease)    Headache    Orbital fracture (HCC)    SAH (subarachnoid hemorrhage) (HCC)    Scapula fracture    Temporal bone fracture (HCC)    Thoracic spine fracture (HCC)     Past Surgical History:  Procedure Laterality Date   COLONOSCOPY WITH PROPOFOL N/A 12/25/2020   Procedure: COLONOSCOPY WITH PROPOFOL;  Surgeon: Pasty Spillers, MD;  Location: [facility] ENDOSCOPY;  Service: Endoscopy;  Laterality: N/A;   ESOPHAGOGASTRODUODENOSCOPY (EGD) WITH PROPOFOL N/A 12/25/2020   Procedure: ESOPHAGOGASTRODUODENOSCOPY (EGD) WITH PROPOFOL;  Surgeon: Pasty Spillers, MD;  Location: [facility] ENDOSCOPY;  Service: Endoscopy;  Laterality: N/A;   KNEE ARTHROSCOPY W/ MENISCAL REPAIR  2018   KNEE ARTHROSCOPY WITH ANTERIOR CRUCIATE LIGAMENT (ACL) REPAIR  2018    Prior to Admission medications   Medication Sig Start Date End Date Taking? Authorizing Provider  cyclobenzaprine (FLEXERIL) 10 MG tablet Take 1 tablet (10 mg total) by mouth 3 (three) times daily as needed for muscle spasms. 09/23/20  Yes  Duanne Limerick, MD  famotidine (PEPCID) 20 MG tablet Take 1 tablet (20 mg total) by mouth at bedtime. 10/22/21 11/21/21 Yes Pasty Spillers, MD  ondansetron (ZOFRAN) 4 MG tablet Take 1 tablet (4 mg total) by mouth every 8 (eight) hours as needed for nausea or vomiting. 08/09/19  Yes Duanne Limerick, MD  pantoprazole (PROTONIX) 20 MG tablet Take 1 tablet (20 mg total) by mouth 2 (two) times daily for 60 days, THEN 1 tablet (20 mg total) daily. 08/14/21 11/12/21 Yes Pasty Spillers, MD  SUMAtriptan (IMITREX) 100 MG tablet Take 100 mg by mouth daily as needed. 09/16/20  Yes [provider]    Family History  Problem Relation Age of Onset   Diabetes Mother    Hypertension Father    Cancer Maternal Grandmother    Diabetes Maternal Grandfather    Heart disease Paternal Grandfather      Social History   Tobacco Use   Smoking status: Every Day    Packs/day: 0.50    Years: 10.00    Pack years: 5.00    Types: Cigarettes   Smokeless tobacco: Former    Types: Snuff    Quit date: 2014  Vaping Use   Vaping Use: Never used  Substance Use Topics   Alcohol use: No    Alcohol/week: 0.0 standard drinks    Comment: occasional   Drug use: Yes    Types: Marijuana  Comment: 2 joints/day    Allergies as of 10/22/2021 - Review Complete 10/22/2021  Allergen Reaction Noted   Ibuprofen Anaphylaxis 05/04/2016   Sulfa antibiotics Other (See Comments) 05/04/2016    Physical Examination:  Constitutional: General:   Alert,  Well-developed, well-nourished, pleasant and cooperative in NAD BP (!) 143/81   Pulse (!) 106   Temp 98.5 F (36.9 C) (Oral)   Wt (!) 319 lb 12.8 oz (145.1 kg)   BMI 42.19 kg/m   Respiratory: Normal respiratory effort  Gastrointestinal:  Soft, tenderness to superficial two finger palpation right upper quadrant, and non-distended without masses, hepatosplenomegaly or hernias noted.  No guarding or rebound tenderness.     Cardiac: No clubbing or edema.   No cyanosis. Normal posterior tibial pedal pulses noted.  Psych:  Alert and cooperative. Normal mood and affect.  Musculoskeletal:  Normal gait. Head normocephalic, atraumatic. Symmetrical without gross deformities. 5/5 Lower extremity strength bilaterally.  Skin: Warm. Intact without significant lesions or rashes. No jaundice.  Neck: Supple, trachea midline  Lymph: No cervical lymphadenopathy  Psych:  Alert and oriented x3, Alert and cooperative. Normal mood and affect.  Labs: CMP     Component Value Date/Time   NA 135 12/02/2020 1152   NA 137 07/04/2019 1414   K 4.0 12/02/2020 1152   CL 99 12/02/2020 1152   CO2 22 12/02/2020 1152   GLUCOSE 81 12/02/2020 1152   BUN 20 12/02/2020 1152   BUN 23 (H) 07/04/2019 1414   CREATININE 0.95 12/02/2020 1152   CALCIUM 8.6 (L) 12/02/2020 1152   PROT 8.1 12/02/2020 1152   ALBUMIN 4.2 12/02/2020 1152   ALBUMIN 4.9 07/04/2019 1414   AST 42 (H) 12/02/2020 1152   ALT 92 (H) 12/02/2020 1152   ALKPHOS 96 12/02/2020 1152   BILITOT 0.6 12/02/2020 1152   GFRNONAA >60 12/02/2020 1152   GFRAA 108 07/04/2019 1414   Lab Results  Component Value Date   WBC 15.5 (H) 08/05/2021   HGB 15.6 08/05/2021   HCT 44.4 08/05/2021   MCV 85.9 08/05/2021   PLT 222 08/05/2021    Imaging Studies:   Assessment and Plan:   Brendan Ball is a 25 y.o. y/o male here for follow-up of reflux, also reporting right upper quadrant pain  His right upper quadrant pain is likely musculoskeletal in etiology Given ongoing symptoms, despite using conservative measures such as ice packs, and heating pads, patient is agreeable to pain management referral, as they may be able to consider steroid injections, or evaluate for anterior cruciate ligament pain as well  I will obtain right upper quadrant ultrasound at this time as well given the location of his pain  Patient is continuing to have breakthrough reflux symptoms despite twice daily PPI Add bedtime Pepcid as most of  symptoms are in the morning Clinic staff will try to reach out to [facility] in regard to his pH study  (Risks of PPI use were discussed with patient including bone loss, C. Diff diarrhea, pneumonia, infections, CKD, electrolyte abnormalities.  Pt. Verbalizes understanding and chooses to continue the medication.)  He has already tried a bed wedge as well  Patient educated on acid reflux lifestyle modification, including using a bed wedge, not eating 3 hrs before bedtime, diet modifications.  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

## 2021-10-22 NOTE — Patient Instructions (Addendum)
Start Pepcid 20mg  at bedtime  Continue Protonix as prescribed  Your ultrasound has been scheduled for October 28, 2021 and you must arrive at 8:30am to [facility] entrance.  You cannot have anything to eat or drink after 12 midnight the day before.  If you need to reschedule, please call [phone]

## 2021-10-24 ENCOUNTER — Telehealth: Payer: Self-pay

## 2021-10-24 LAB — ANTI-SMOOTH MUSCLE ANTIBODY, IGG: Smooth Muscle Ab: 76 Units — ABNORMAL HIGH (ref 0–19)

## 2021-10-24 NOTE — Telephone Encounter (Signed)
Referral has been placed. 

## 2021-10-24 NOTE — Telephone Encounter (Signed)
-----   Message from Pasty Spillers, MD sent at 10/23/2021 12:22 PM EDT ----- Please place referral to pain management for "costochondritis, right upper quadrant pain".  I already discussed this with the patient during his clinic visit and he was agreeable.

## 2021-10-24 NOTE — Addendum Note (Signed)
Addended by: Roena Malady on: 10/24/2021 11:30 AM   Modules accepted: Orders

## 2021-10-28 ENCOUNTER — Ambulatory Visit
Admission: RE | Admit: 2021-10-28 | Discharge: 2021-10-28 | Disposition: A | Discharge status: Home / Self Care | Payer: 59 | Source: Ambulatory Visit | Attending: Gastroenterology | Admitting: Gastroenterology

## 2021-10-28 ENCOUNTER — Other Ambulatory Visit: Payer: Self-pay

## 2021-10-28 DIAGNOSIS — R1011 Right upper quadrant pain: Secondary | ICD-10-CM | POA: Insufficient documentation

## 2021-10-29 NOTE — Addendum Note (Signed)
Addended by: Roena Malady on: 10/29/2021 05:02 PM   Modules accepted: Orders

## 2021-11-03 ENCOUNTER — Telehealth: Payer: Self-pay

## 2021-11-03 NOTE — Telephone Encounter (Signed)
Patient was called to let him know that his US/Biopsy was scheduled for 11/28/2021. Patient agreed and had no further questions.

## 2021-11-18 ENCOUNTER — Ambulatory Visit: Payer: 59

## 2021-11-25 ENCOUNTER — Other Ambulatory Visit: Payer: Self-pay | Admitting: Radiology

## 2021-11-26 ENCOUNTER — Ambulatory Visit
Admission: RE | Admit: 2021-11-26 | Discharge: 2021-11-26 | Disposition: A | Discharge status: Home / Self Care | Payer: 59 | Source: Ambulatory Visit | Attending: Gastroenterology | Admitting: Gastroenterology

## 2021-11-26 ENCOUNTER — Other Ambulatory Visit: Payer: Self-pay

## 2021-11-26 DIAGNOSIS — R1011 Right upper quadrant pain: Secondary | ICD-10-CM | POA: Diagnosis present

## 2021-11-26 DIAGNOSIS — R748 Abnormal levels of other serum enzymes: Secondary | ICD-10-CM

## 2021-11-26 DIAGNOSIS — K7581 Nonalcoholic steatohepatitis (NASH): Secondary | ICD-10-CM | POA: Insufficient documentation

## 2021-11-26 DIAGNOSIS — R7989 Other specified abnormal findings of blood chemistry: Secondary | ICD-10-CM | POA: Diagnosis present

## 2021-11-26 LAB — CBC
HCT: 45.7 % (ref 39.0–52.0)
Hemoglobin: 15.9 g/dL (ref 13.0–17.0)
MCH: 30.2 pg (ref 26.0–34.0)
MCHC: 34.8 g/dL (ref 30.0–36.0)
MCV: 86.7 fL (ref 80.0–100.0)
Platelets: 206 10*3/uL (ref 150–400)
RBC: 5.27 MIL/uL (ref 4.22–5.81)
RDW: 12.2 % (ref 11.5–15.5)
WBC: 14.2 10*3/uL — ABNORMAL HIGH (ref 4.0–10.5)
nRBC: 0 % (ref 0.0–0.2)

## 2021-11-26 LAB — PROTIME-INR
INR: 1 (ref 0.8–1.2)
Prothrombin Time: 13.2 seconds (ref 11.4–15.2)

## 2021-11-26 MED ORDER — SODIUM CHLORIDE 0.9 % IV SOLN: INTRAVENOUS | Status: DC

## 2021-11-26 MED ORDER — MIDAZOLAM HCL 2 MG/2ML IJ SOLN
INTRAMUSCULAR | Status: AC | PRN
  Administered 2021-11-26 (×2): 1 mg via INTRAVENOUS

## 2021-11-26 MED ORDER — MIDAZOLAM HCL 2 MG/2ML IJ SOLN
INTRAMUSCULAR | Status: AC
  Filled 2021-11-26: qty 2

## 2021-11-26 MED ORDER — FENTANYL CITRATE (PF) 100 MCG/2ML IJ SOLN
INTRAMUSCULAR | Status: AC
  Filled 2021-11-26: qty 2

## 2021-11-26 MED ORDER — HYDROCODONE-ACETAMINOPHEN 5-325 MG PO TABS: 1.0000 | ORAL_TABLET | ORAL | Status: DC | PRN

## 2021-11-26 MED ORDER — FENTANYL CITRATE (PF) 100 MCG/2ML IJ SOLN
INTRAMUSCULAR | Status: AC | PRN
  Administered 2021-11-26 (×2): 50 ug via INTRAVENOUS

## 2021-11-26 NOTE — Procedures (Signed)
Pre Procedure Dx: Elevated LFTs of uncertain etiology Post Procedural Dx: Same  Technically successful US guided biopsy of right lobe of the liver.  EBL: None No immediate complications.   Jay Nellie Chevalier, MD Pager #: [phone]    

## 2021-11-26 NOTE — H&P (Signed)
Chief Complaint: Patient was seen in consultation today for elevated liver function labs at the request of Tahiliani,Varnita B  Referring Physician(s): Tahiliani,Varnita B  Supervising Physician: Simonne Come  Patient Status: [facility] - Out-pt  History of Present Illness: Brendan Ball is a 25 y.o. male with complaints of RUQ abdominal pain, nausea and severe reflux despite medical therapy. The patient has been evaluated by GI and recent lab work elevated LFTS and RUQ US findings of hepatic steatosis with request received for image guided non target liver biopsy.   The patient admits to current 3/10 RUQ dull pain, he denies any current chest pain or shortness of breath. He denies any current nausea or recent vomiting. He denies any recent blood thinner use or known bleeding or clotting disorder. The patient denies any history of sleep apnea or chronic oxygen use. He has no known complications to sedation.    Past Medical History:  Diagnosis Date   GERD (gastroesophageal reflux disease)    Headache    Orbital fracture (HCC)    SAH (subarachnoid hemorrhage) (HCC)    Scapula fracture    Temporal bone fracture (HCC)    Thoracic spine fracture [facility])     Past Surgical History:  Procedure Laterality Date   COLONOSCOPY WITH PROPOFOL N/A 12/25/2020   Procedure: COLONOSCOPY WITH PROPOFOL;  Surgeon: Pasty Spillers, MD;  Location: [facility] ENDOSCOPY;  Service: Endoscopy;  Laterality: N/A;   ESOPHAGOGASTRODUODENOSCOPY (EGD) WITH PROPOFOL N/A 12/25/2020   Procedure: ESOPHAGOGASTRODUODENOSCOPY (EGD) WITH PROPOFOL;  Surgeon: Pasty Spillers, MD;  Location: [facility] ENDOSCOPY;  Service: Endoscopy;  Laterality: N/A;   KNEE ARTHROSCOPY W/ MENISCAL REPAIR  2018   KNEE ARTHROSCOPY WITH ANTERIOR CRUCIATE LIGAMENT (ACL) REPAIR  2018    Allergies: Ibuprofen and Sulfa antibiotics  Medications: Prior to Admission medications   Medication Sig Start Date End Date Taking? Authorizing Provider   cyclobenzaprine (FLEXERIL) 10 MG tablet Take 1 tablet (10 mg total) by mouth 3 (three) times daily as needed for muscle spasms. 09/23/20   Duanne Limerick, MD  famotidine (PEPCID) 20 MG tablet Take 1 tablet (20 mg total) by mouth at bedtime. 10/22/21 11/21/21  Pasty Spillers, MD  ondansetron (ZOFRAN) 4 MG tablet Take 1 tablet (4 mg total) by mouth every 8 (eight) hours as needed for nausea or vomiting. 08/09/19   Duanne Limerick, MD  pantoprazole (PROTONIX) 20 MG tablet Take 1 tablet (20 mg total) by mouth 2 (two) times daily for 60 days, THEN 1 tablet (20 mg total) daily. 08/14/21 11/12/21  Pasty Spillers, MD  SUMAtriptan (IMITREX) 100 MG tablet Take 100 mg by mouth daily as needed. 09/16/20   [provider]     Family History  Problem Relation Age of Onset   Diabetes Mother    Hypertension Father    Cancer Maternal Grandmother    Diabetes Maternal Grandfather    Heart disease Paternal Grandfather     Social History   Socioeconomic History   Marital status: Married    Spouse name: Not on file   Number of children: Not on file   Years of education: Not on file   Highest education level: Not on file  Occupational History   Not on file  Tobacco Use   Smoking status: Every Day    Packs/day: 0.50    Years: 10.00    Pack years: 5.00    Types: Cigarettes   Smokeless tobacco: Former    Types: Snuff    Quit date:  2014  Vaping Use   Vaping Use: Never used  Substance and Sexual Activity   Alcohol use: No    Alcohol/week: 0.0 standard drinks    Comment: occasional   Drug use: Yes    Types: Marijuana    Comment: 2 joints/day   Sexual activity: Yes  Other Topics Concern   Not on file  Social History Narrative   Not on file   Social Determinants of Health   Financial Resource Strain: Not on file  Food Insecurity: Not on file  Transportation Needs: Not on file  Physical Activity: Not on file  Stress: Not on file  Social Connections: Not on file    Review  of Systems: A 12 point ROS discussed and pertinent positives are indicated in the HPI above.  All other systems are negative.  Review of Systems  Vital Signs: There were no vitals taken for this visit.  Physical Exam Constitutional:      Appearance: Normal appearance.  HENT:     Head: Normocephalic and atraumatic.  Cardiovascular:     Rate and Rhythm: Normal rate and regular rhythm.  Pulmonary:     Effort: Pulmonary effort is normal. No respiratory distress.  Abdominal:     General: There is distension.     Palpations: Abdomen is soft.     Tenderness: There is abdominal tenderness.  Neurological:     Mental Status: He is alert and oriented to person, place, and time.    Imaging: US ABDOMEN LIMITED RUQ (LIVER/GB)  Result Date: 10/28/2021 CLINICAL DATA:  Liver lesion Right upper quadrant pain for 1 year EXAM: ULTRASOUND ABDOMEN LIMITED RIGHT UPPER QUADRANT COMPARISON:  None. FINDINGS: Gallbladder: No gallstones or wall thickening visualized. No sonographic Murphy sign noted by sonographer. Common bile duct: Diameter: 5 mm Liver: No focal lesion. Diffusely increased parenchymal echogenicity. Portal vein is patent on color Doppler imaging with normal direction of blood flow towards the liver. Other: None. IMPRESSION: 1. Diffuse increased echogenicity of the hepatic parenchyma is a nonspecific indicator of hepatocellular dysfunction, most commonly steatosis. 2. No liver lesion is identified. If there is continued high clinical suspicion for hepatic lesion, further evaluation with contrast enhanced abdominal MRI should be performed. Electronically Signed   By: Miachel Roux M.D.   On: 10/28/2021 16:30    Labs: pending at time of note, being drawn  CBC: Recent Labs    12/02/20 1152 01/21/21 1449 08/05/21 1440  WBC 17.8* 15.7* 15.5*  HGB 16.1 15.2 15.6  HCT 46.8 44.9 44.4  PLT 232 227 222    COAGS: No results for input(s): INR, APTT in the last 8760 hours.  BMP: Recent Labs     12/02/20 1152  NA 135  K 4.0  CL 99  CO2 22  GLUCOSE 81  BUN 20  CALCIUM 8.6*  CREATININE 0.95  GFRNONAA >60    LIVER FUNCTION TESTS: Recent Labs    12/02/20 1152  BILITOT 0.6  AST 42*  ALT 92*  ALKPHOS 96  PROT 8.1  ALBUMIN 4.2    Assessment and Plan: 25 year old male with complaints of RUQ abdominal pain, nausea and severe reflux despite medical therapy. The patient has been evaluated by GI and recent lab work elevated LFTS and RUQ US findings of hepatic steatosis with request received for image guided non target liver biopsy.   The patient has been NPO, no blood thinners taken, imaging, labs and vitals have been reviewed.  Risks and benefits of image guided non target  liver biopsy with moderate sedation was discussed with the patient and/or patient's family including, but not limited to bleeding, infection, damage to adjacent structures or low yield requiring additional tests.  All of the questions were answered and there is agreement to proceed.  Consent signed and in chart.   Thank you for this interesting consult.  I greatly enjoyed meeting Brendan Ball and look forward to participating in their care.  A copy of this report was sent to the requesting provider on this date.  Electronically Signed: Hedy Jacob, PA-C 11/26/2021, 8:42 AM   I spent a total of 15 Minutes in face to face in clinical consultation, greater than 50% of which was counseling/coordinating care for elevated liver function labs.

## 2021-11-26 NOTE — OR Nursing (Signed)
Telephone call from patient reporting "bad pain at biopsy site. Dr Lowella Dandy called. He said he can take 650 mg tylenol or aleve. But dont take it more than once or twice a day. No symptoms of bleeding.

## 2021-11-26 NOTE — Progress Notes (Signed)
Patient clinically stable post Liver Biopsy per Dr Grace Isaac, tolerated well. Denies complaints at this time. Received Versed 2 mg along with Fentanyl 100 mcg IV for procedure. Report given to Lea Rn post procedure/specials.

## 2021-11-28 LAB — SURGICAL PATHOLOGY

## 2022-01-22 ENCOUNTER — Other Ambulatory Visit: Payer: Self-pay | Admitting: Family Medicine

## 2022-01-22 DIAGNOSIS — K219 Gastro-esophageal reflux disease without esophagitis: Secondary | ICD-10-CM

## 2022-01-22 MED ORDER — PANTOPRAZOLE SODIUM 40 MG PO TBEC: 40.0000 mg | DELAYED_RELEASE_TABLET | Freq: Every day | ORAL | 0 refills | Status: DC

## 2022-06-29 ENCOUNTER — Ambulatory Visit (INDEPENDENT_AMBULATORY_CARE_PROVIDER_SITE_OTHER): Payer: Self-pay | Admitting: Family Medicine

## 2022-06-29 ENCOUNTER — Encounter: Payer: Self-pay | Admitting: Family Medicine

## 2022-06-29 DIAGNOSIS — K219 Gastro-esophageal reflux disease without esophagitis: Secondary | ICD-10-CM

## 2022-06-29 MED ORDER — PROPRANOLOL HCL 20 MG PO TABS: 20.0000 mg | ORAL_TABLET | Freq: Every day | ORAL | 11 refills | Status: DC | PRN

## 2022-06-29 MED ORDER — PANTOPRAZOLE SODIUM 40 MG PO TBEC: 40.0000 mg | DELAYED_RELEASE_TABLET | Freq: Every day | ORAL | 1 refills | Status: DC

## 2022-06-29 NOTE — Progress Notes (Signed)
Date:  06/29/2022   Name:  Brendan Ball   DOB:  1996/05/13   MRN:  [id number]   Chief Complaint: Gastroesophageal Reflux and Migraine  Gastroesophageal Reflux He reports no abdominal pain, no belching, no chest pain, no choking, no coughing, no dysphagia, no early satiety, no globus sensation, no heartburn, no hoarse voice, no nausea, no sore throat, no stridor or no wheezing. This is a chronic problem. The current episode started more than 1 year ago. The problem has been gradually improving. The symptoms are aggravated by certain foods. Pertinent negatives include no anemia, fatigue, melena, muscle weakness, orthopnea or weight loss. He has tried a PPI for the symptoms. The treatment provided moderate relief.  Migraine  This is a chronic problem. The current episode started more than 1 year ago. The problem has been gradually improving. The pain is located in the Retro-orbital and bilateral region. The quality of the pain is described as aching. Associated symptoms include photophobia. Pertinent negatives include no abdominal pain, back pain, coughing, dizziness, ear pain, fever, nausea, neck pain, sore throat or weight loss. The treatment provided mild relief.    Lab Results  Component Value Date   NA 135 12/02/2020   K 4.0 12/02/2020   CO2 22 12/02/2020   GLUCOSE 81 12/02/2020   BUN 20 12/02/2020   CREATININE 0.95 12/02/2020   CALCIUM 8.6 (L) 12/02/2020   GFRNONAA >60 12/02/2020   No results found for: "CHOL", "HDL", "LDLCALC", "LDLDIRECT", "TRIG", "CHOLHDL" No results found for: "TSH" Lab Results  Component Value Date   HGBA1C 5.3 07/04/2019   Lab Results  Component Value Date   WBC 14.2 (H) 11/26/2021   HGB 15.9 11/26/2021   HCT 45.7 11/26/2021   MCV 86.7 11/26/2021   PLT 206 11/26/2021   Lab Results  Component Value Date   ALT 92 (H) 12/02/2020   AST 42 (H) 12/02/2020   ALKPHOS 96 12/02/2020   BILITOT 0.6 12/02/2020   No results found for: "25OHVITD2",  "25OHVITD3", "VD25OH"   Review of Systems  Constitutional:  Negative for chills, fatigue, fever and weight loss.  HENT:  Negative for drooling, ear discharge, ear pain, hoarse voice and sore throat.   Eyes:  Positive for photophobia.  Respiratory:  Negative for cough, choking, shortness of breath and wheezing.   Cardiovascular:  Negative for chest pain, palpitations and leg swelling.  Gastrointestinal:  Negative for abdominal pain, blood in stool, constipation, diarrhea, dysphagia, heartburn, melena and nausea.  Endocrine: Negative for polydipsia.  Genitourinary:  Negative for dysuria, frequency, hematuria and urgency.  Musculoskeletal:  Negative for back pain, myalgias, muscle weakness and neck pain.  Skin:  Negative for rash.  Allergic/Immunologic: Negative for environmental allergies.  Neurological:  Negative for dizziness and headaches.  Hematological:  Does not bruise/bleed easily.  Psychiatric/Behavioral:  Negative for suicidal ideas. The patient is not nervous/anxious.     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   Fracture of temporal bone (HCC) 10/12/2012  Fracture of thoracic spine (HCC) 10/12/2012    Allergies  Allergen Reactions   Ibuprofen Anaphylaxis   Sulfa Antibiotics Other (See Comments)    Past Surgical History:  Procedure Laterality Date   COLONOSCOPY WITH PROPOFOL N/A 12/25/2020   Procedure: COLONOSCOPY WITH PROPOFOL;  Surgeon:  Pasty Spillers, MD;  Location: [facility];  Service: Endoscopy;  Laterality: N/A;   ESOPHAGOGASTRODUODENOSCOPY (EGD) WITH PROPOFOL N/A 12/25/2020   Procedure: ESOPHAGOGASTRODUODENOSCOPY (EGD) WITH PROPOFOL;  Surgeon: Pasty Spillers, MD;  Location: [facility];  Service: Endoscopy;  Laterality: N/A;   KNEE ARTHROSCOPY W/ MENISCAL REPAIR  2018   KNEE ARTHROSCOPY WITH ANTERIOR CRUCIATE LIGAMENT (ACL) REPAIR  2018    Social History   Tobacco Use   Smoking status: Every Day    Packs/day: 0.50    Years: 10.00    Total pack years: 5.00    Types: Cigarettes   Smokeless tobacco: Former    Types: Snuff    Quit date: 2014  Vaping Use   Vaping Use: Never used  Substance Use Topics   Alcohol use: No    Alcohol/week: 0.0 standard drinks of alcohol    Comment: occasional   Drug use: Yes    Types: Marijuana    Comment: 2 joints/day     Medication list has been reviewed and updated.  Current Meds  Medication Sig   ondansetron (ZOFRAN) 4 MG tablet Take 1 tablet (4 mg total) by mouth every 8 (eight) hours as needed for nausea or vomiting.   pantoprazole (PROTONIX) 40 MG tablet Take 1 tablet (40 mg total) by mouth daily.   [DISCONTINUED] cyclobenzaprine (FLEXERIL) 10 MG tablet Take 1 tablet (10 mg total) by mouth 3 (three) times daily as needed for muscle spasms.   [DISCONTINUED] famotidine (PEPCID) 20 MG tablet Take 1 tablet (20 mg total) by mouth at bedtime.       06/29/2022    3:50 PM 09/23/2020    2:39 PM  GAD 7 : Generalized Anxiety Score  Nervous, Anxious, on Edge 0 0  Control/stop worrying 0 0  Worry too much - different things 0 0  Trouble relaxing 0 0  Restless 0 0  Easily annoyed or irritable 0 0  Afraid - awful might happen 0 0  Total GAD 7 Score 0 0  Anxiety Difficulty Not difficult at all Not difficult at all       06/29/2022    3:50 PM 09/23/2020    2:39 PM 07/04/2019    1:23 PM  Depression screen PHQ 2/9  Decreased Interest 0 0 0  Down, Depressed,  Hopeless 0 0 0  PHQ - 2 Score 0 0 0  Altered sleeping 0 3 1  Tired, decreased energy 0 0 1  Change in appetite 0 0 0  Feeling bad or failure about yourself  0 0 0  Trouble concentrating 0 0 0  Moving slowly or fidgety/restless 0 0 0  Suicidal thoughts 0 0 0  PHQ-9 Score 0 3 2  Difficult doing work/chores Not difficult at all Not difficult at all Not difficult at all    BP Readings from Last 3 Encounters:  06/29/22 130/72  11/26/21 109/90  10/22/21 (!) 143/81    Physical Exam Vitals and nursing note reviewed.  HENT:     Head: Normocephalic.     Right Ear: Tympanic membrane and external ear normal.     Left Ear: Tympanic membrane and external ear normal.     Nose: Nose normal. No congestion  or rhinorrhea.     Mouth/Throat:     Mouth: Mucous membranes are moist.  Eyes:     General: No scleral icterus.       Right eye: No discharge.        Left eye: No discharge.     Conjunctiva/sclera: Conjunctivae normal.     Pupils: Pupils are equal, round, and reactive to light.  Neck:     Thyroid: No thyromegaly.     Vascular: No JVD.     Trachea: No tracheal deviation.  Cardiovascular:     Rate and Rhythm: Normal rate and regular rhythm.     Heart sounds: Normal heart sounds. No murmur heard.    No friction rub. No gallop.  Pulmonary:     Effort: No respiratory distress.     Breath sounds: Normal breath sounds. No wheezing or rales.  Abdominal:     General: Bowel sounds are normal.     Palpations: Abdomen is soft. There is no mass.     Tenderness: There is no abdominal tenderness. There is no guarding or rebound.     Hernia: No hernia is present.  Musculoskeletal:        General: No tenderness. Normal range of motion.     Cervical back: Normal range of motion and neck supple.  Lymphadenopathy:     Cervical: No cervical adenopathy.  Skin:    General: Skin is warm.     Findings: No rash.  Neurological:     Mental Status: He is alert and oriented to person, place, and time.      Cranial Nerves: No cranial nerve deficit.     Deep Tendon Reflexes: Reflexes are normal and symmetric.     Wt Readings from Last 3 Encounters:  06/29/22 (!) 305 lb (138.3 kg)  11/26/21 290 lb (131.5 kg)  10/22/21 (!) 319 lb 12.8 oz (145.1 kg)    BP 130/72   Pulse (!) 101   Ht 6' (1.829 m)   Wt (!) 305 lb (138.3 kg)   SpO2 97%   BMI 41.37 kg/m   Assessment and Plan:  1. Gastroesophageal reflux disease without esophagitis Chronic.  Controlled.  Stable.  Continue pantoprazole 40 mg once a day. - pantoprazole (PROTONIX) 40 MG tablet; Take 1 tablet (40 mg total) by mouth daily.  Dispense: 90 tablet; Refill: 1

## 2022-10-08 IMAGING — RF DG ESOPHAGUS
4 series · 4 of 4 positions shown · non-contrast
Comparison: None.

CLINICAL DATA: Rule out hiatal hernia

EXAM:
ESOPHOGRAM / BARIUM SWALLOW / BARIUM TABLET STUDY
TECHNIQUE: Combined double contrast and single contrast examination performed
using effervescent crystals, thick barium liquid, and thin barium
liquid. The patient was observed with fluoroscopy swallowing a 13 mm
barium sulphate tablet.
FLUOROSCOPY TIME:  Fluoroscopy Time:  24 seconds
Radiation Exposure Index (if provided by the fluoroscopic device):
3.9 mGy
Number of Acquired Spot Images: 0

[Series 3: cp_standard · 0.25mm/px · 1 of 1 slices shown (1 of 4)]
[im 1/1]
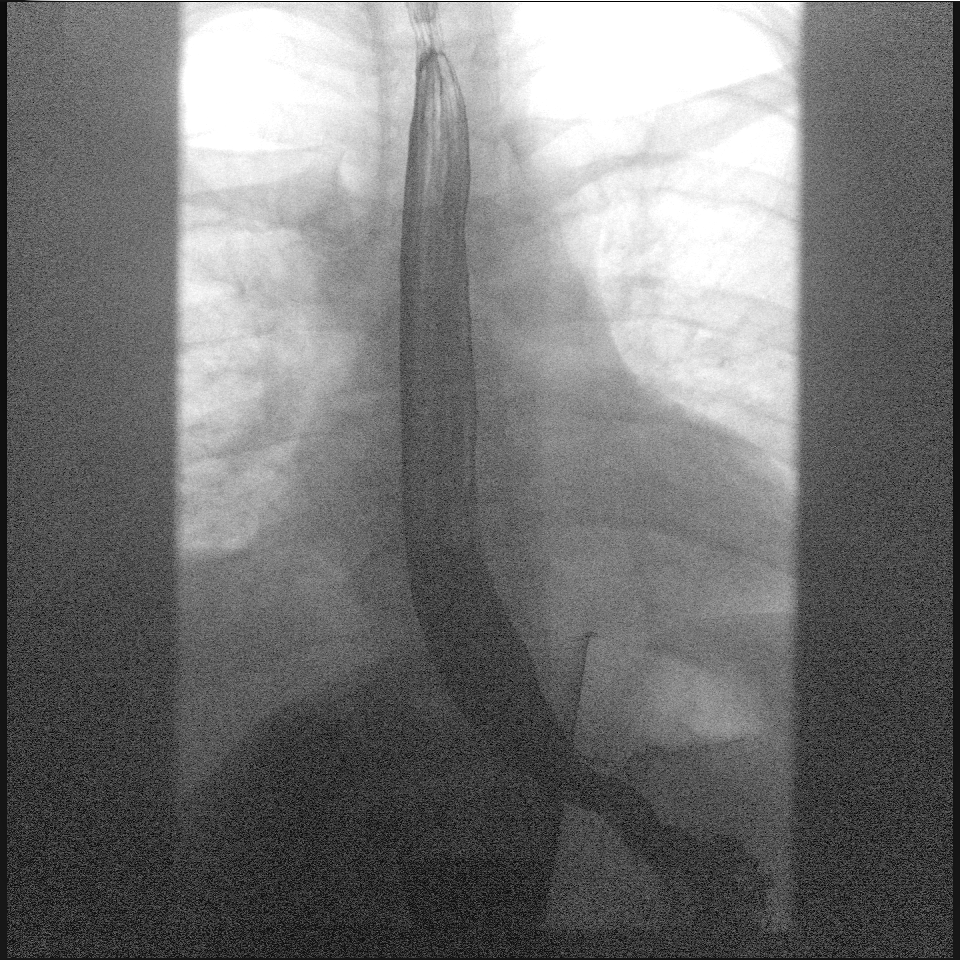

[Series 5: cp_standard · 0.26mm/px · 1 of 1 slices shown (2 of 4)]
[im 1/1]
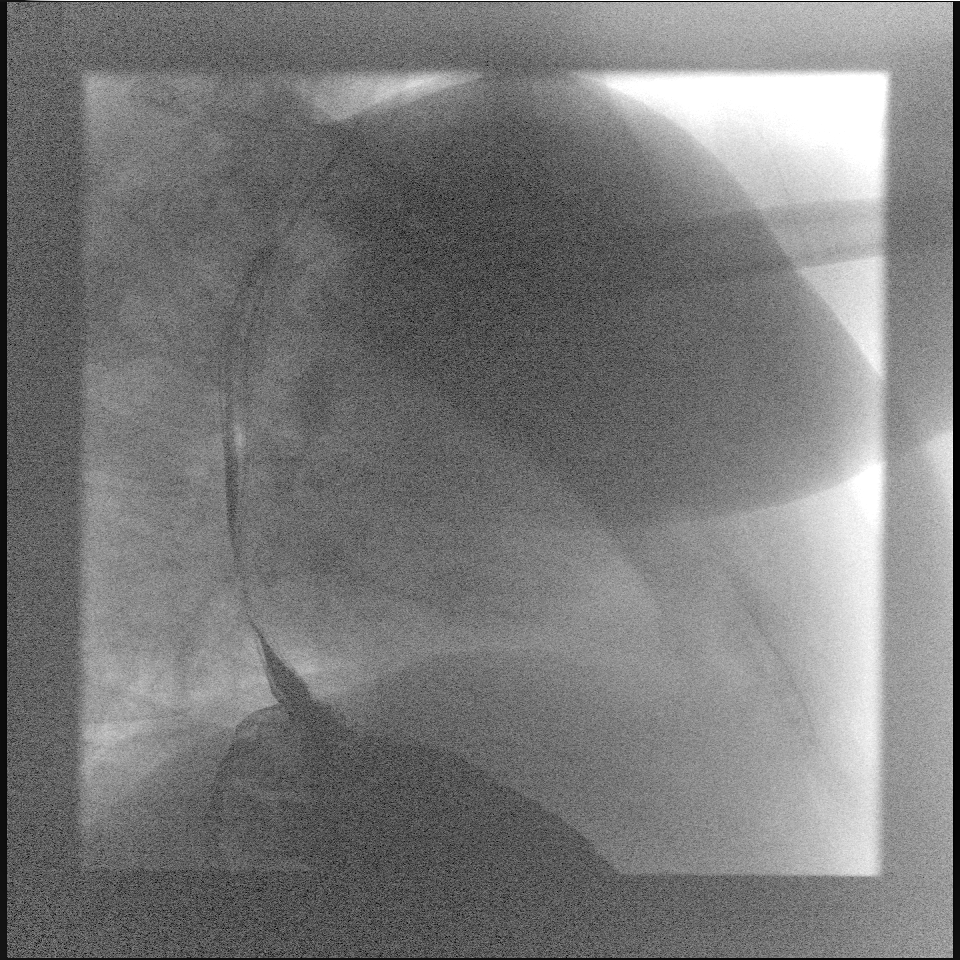

[Series 6: cp_standard · 0.26mm/px · 1 of 1 slices shown (3 of 4)]
[im 1/1]
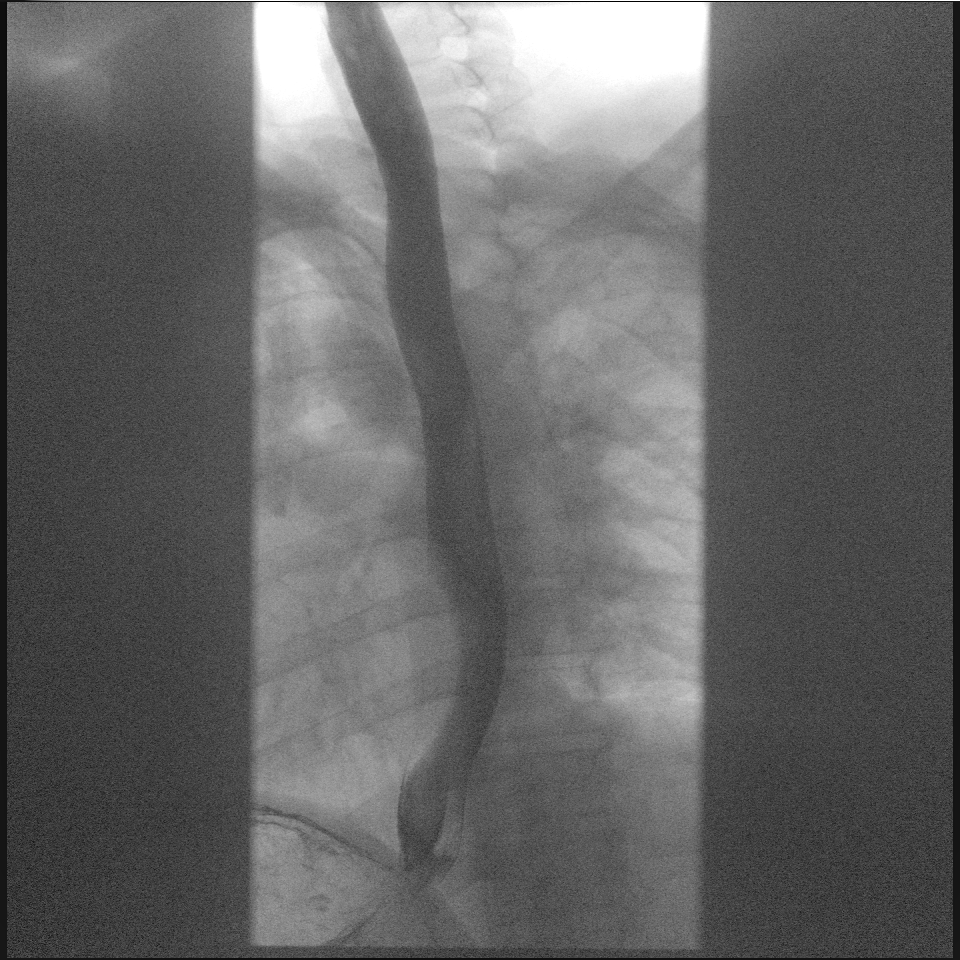

[Series 7: cp_standard · 0.26mm/px · 1 of 1 slices shown (4 of 4)]
[im 1/1]
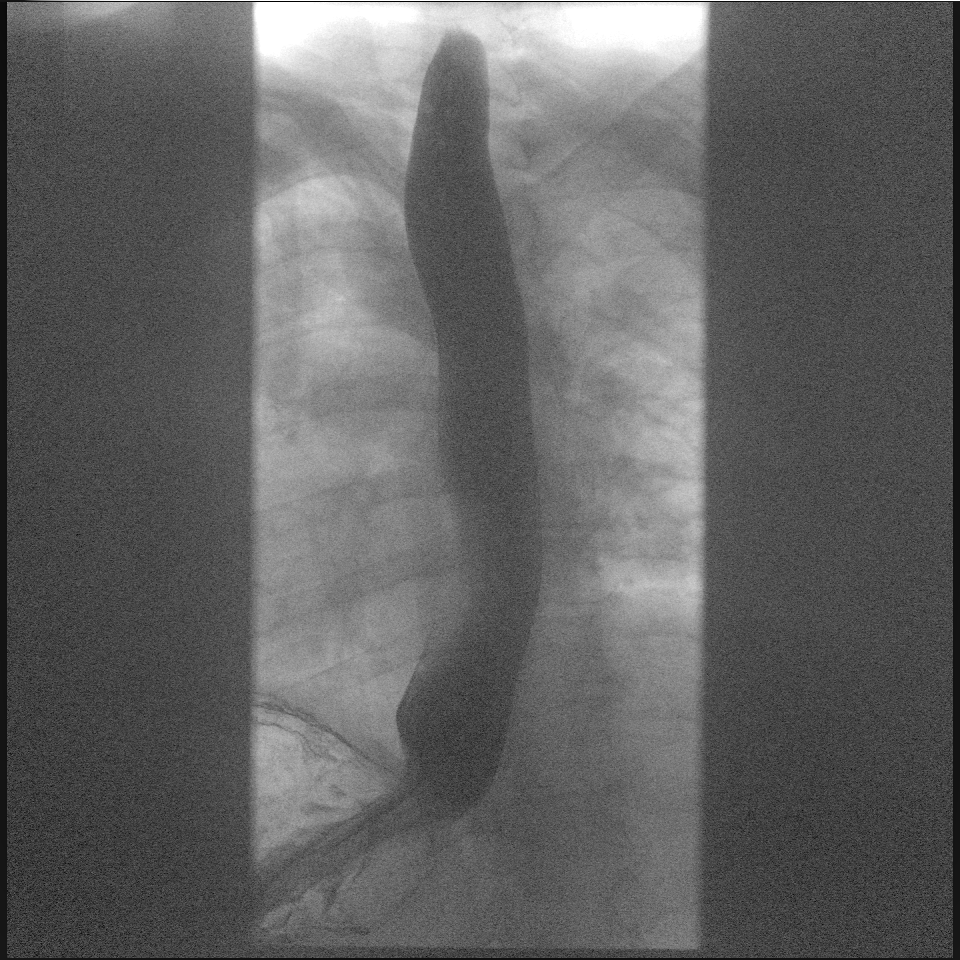

[4 of 4 positions shown; findings below may reference images not displayed]

FINDINGS: Normal pharyngeal anatomy and motility. Contrast flowed freely
through the esophagus without evidence of a stricture or mass.
Normal esophageal mucosa without evidence of irregularity or
ulceration. Esophageal motility was normal. Severe gastroesophageal
reflux. No definite hiatal hernia was demonstrated.

At the end of the examination a 13 mm barium tablet was administered
which transited through the esophagus and esophagogastric junction
without delay.
IMPRESSION: Severe gastroesophageal reflux.

## 2022-12-31 ENCOUNTER — Ambulatory Visit (INDEPENDENT_AMBULATORY_CARE_PROVIDER_SITE_OTHER): Payer: 59 | Admitting: Family Medicine

## 2022-12-31 ENCOUNTER — Encounter: Payer: Self-pay | Admitting: Family Medicine

## 2022-12-31 VITALS — BP 124/76 | HR 92 | Ht 73.0 in | Wt 311.0 lb

## 2022-12-31 DIAGNOSIS — Z6841 Body Mass Index (BMI) 40.0 and over, adult: Secondary | ICD-10-CM

## 2022-12-31 DIAGNOSIS — D72829 Elevated white blood cell count, unspecified: Secondary | ICD-10-CM

## 2022-12-31 DIAGNOSIS — R7989 Other specified abnormal findings of blood chemistry: Secondary | ICD-10-CM

## 2022-12-31 DIAGNOSIS — K219 Gastro-esophageal reflux disease without esophagitis: Secondary | ICD-10-CM | POA: Diagnosis not present

## 2022-12-31 DIAGNOSIS — K760 Fatty (change of) liver, not elsewhere classified: Secondary | ICD-10-CM

## 2022-12-31 DIAGNOSIS — Z23 Encounter for immunization: Secondary | ICD-10-CM

## 2022-12-31 DIAGNOSIS — F17200 Nicotine dependence, unspecified, uncomplicated: Secondary | ICD-10-CM

## 2022-12-31 MED ORDER — PANTOPRAZOLE SODIUM 40 MG PO TBEC: 40.0000 mg | DELAYED_RELEASE_TABLET | Freq: Every day | ORAL | 1 refills | Status: DC

## 2022-12-31 NOTE — Patient Instructions (Signed)
Calorie Counting for Weight Loss Calories are units of energy. Your body needs a certain number of calories from food to keep going throughout the day. When you eat or drink more calories than your body needs, your body stores the extra calories mostly as fat. When you eat or drink fewer calories than your body needs, your body burns fat to get the energy it needs. Calorie counting means keeping track of how many calories you eat and drink each day. Calorie counting can be helpful if you need to lose weight. If you eat fewer calories than your body needs, you should lose weight. Ask your health care provider what a healthy weight is for you. For calorie counting to work, you will need to eat the right number of calories each day to lose a healthy amount of weight per week. A dietitian can help you figure out how many calories you need in a day and will suggest ways to reach your calorie goal. A healthy amount of weight to lose each week is usually 1-2 lb (0.5-0.9 kg). This usually means that your daily calorie intake should be reduced by 500-750 calories. Eating 1,200-1,500 calories a day can help most women lose weight. Eating 1,500-1,800 calories a day can help most men lose weight. What do I need to know about calorie counting? Work with your health care provider or dietitian to determine how many calories you should get each day. To meet your daily calorie goal, you will need to: Find out how many calories are in each food that you would like to eat. Try to do this before you eat. Decide how much of the food you plan to eat. Keep a food log. Do this by writing down what you ate and how many calories it had. To successfully lose weight, it is important to balance calorie counting with a healthy lifestyle that includes regular activity. Where do I find calorie information?  The number of calories in a food can be found on a Nutrition Facts label. If a food does not have a Nutrition Facts label, try  to look up the calories online or ask your dietitian for help. Remember that calories are listed per serving. If you choose to have more than one serving of a food, you will have to multiply the calories per serving by the number of servings you plan to eat. For example, the label on a package of bread might say that a serving size is 1 slice and that there are 90 calories in a serving. If you eat 1 slice, you will have eaten 90 calories. If you eat 2 slices, you will have eaten 180 calories. How do I keep a food log? After each time that you eat, record the following in your food log as soon as possible: What you ate. Be sure to include toppings, sauces, and other extras on the food. How much you ate. This can be measured in cups, ounces, or number of items. How many calories were in each food and drink. The total number of calories in the food you ate. Keep your food log near you, such as in a pocket-sized notebook or on an app or website on your mobile phone. Some programs will calculate calories for you and show you how many calories you have left to meet your daily goal. What are some portion-control tips? Know how many calories are in a serving. This will help you know how many servings you can have of a certain   food. Use a measuring cup to measure serving sizes. You could also try weighing out portions on a kitchen scale. With time, you will be able to estimate serving sizes for some foods. Take time to put servings of different foods on your favorite plates or in your favorite bowls and cups so you know what a serving looks like. Try not to eat straight from a food's packaging, such as from a bag or box. Eating straight from the package makes it hard to see how much you are eating and can lead to overeating. Put the amount you would like to eat in a cup or on a plate to make sure you are eating the right portion. Use smaller plates, glasses, and bowls for smaller portions and to prevent  overeating. Try not to multitask. For example, avoid watching TV or using your computer while eating. If it is time to eat, sit down at a table and enjoy your food. This will help you recognize when you are full. It will also help you be more mindful of what and how much you are eating. What are tips for following this plan? Reading food labels Check the calorie count compared with the serving size. The serving size may be smaller than what you are used to eating. Check the source of the calories. Try to choose foods that are high in protein, fiber, and vitamins, and low in saturated fat, trans fat, and sodium. Shopping Read nutrition labels while you shop. This will help you make healthy decisions about which foods to buy. Pay attention to nutrition labels for low-fat or fat-free foods. These foods sometimes have the same number of calories or more calories than the full-fat versions. They also often have added sugar, starch, or salt to make up for flavor that was removed with the fat. Make a grocery list of lower-calorie foods and stick to it. Cooking Try to cook your favorite foods in a healthier way. For example, try baking instead of frying. Use low-fat dairy products. Meal planning Use more fruits and vegetables. One-half of your plate should be fruits and vegetables. Include lean proteins, such as chicken, turkey, and fish. Lifestyle Each week, aim to do one of the following: 150 minutes of moderate exercise, such as walking. 75 minutes of vigorous exercise, such as running. General information Know how many calories are in the foods you eat most often. This will help you calculate calorie counts faster. Find a way of tracking calories that works for you. Get creative. Try different apps or programs if writing down calories does not work for you. What foods should I eat?  Eat nutritious foods. It is better to have a nutritious, high-calorie food, such as an avocado, than a food with  few nutrients, such as a bag of potato chips. Use your calories on foods and drinks that will fill you up and will not leave you hungry soon after eating. Examples of foods that fill you up are nuts and nut butters, vegetables, lean proteins, and high-fiber foods such as whole grains. High-fiber foods are foods with more than 5 g of fiber per serving. Pay attention to calories in drinks. Low-calorie drinks include water and unsweetened drinks. The items listed above may not be a complete list of foods and beverages you can eat. Contact a dietitian for more information. What foods should I limit? Limit foods or drinks that are not good sources of vitamins, minerals, or protein or that are high in unhealthy fats. These   include: Candy. Other sweets. Sodas, specialty coffee drinks, alcohol, and juice. The items listed above may not be a complete list of foods and beverages you should avoid. Contact a dietitian for more information. How do I count calories when eating out? Pay attention to portions. Often, portions are much larger when eating out. Try these tips to keep portions smaller: Consider sharing a meal instead of getting your own. If you get your own meal, eat only half of it. Before you start eating, ask for a container and put half of your meal into it. When available, consider ordering smaller portions from the menu instead of full portions. Pay attention to your food and drink choices. Knowing the way food is cooked and what is included with the meal can help you eat fewer calories. If calories are listed on the menu, choose the lower-calorie options. Choose dishes that include vegetables, fruits, whole grains, low-fat dairy products, and lean proteins. Choose items that are boiled, broiled, grilled, or steamed. Avoid items that are buttered, battered, fried, or served with cream sauce. Items labeled as crispy are usually fried, unless stated otherwise. Choose water, low-fat milk,  unsweetened iced tea, or other drinks without added sugar. If you want an alcoholic beverage, choose a lower-calorie option, such as a glass of wine or light beer. Ask for dressings, sauces, and syrups on the side. These are usually high in calories, so you should limit the amount you eat. If you want a salad, choose a garden salad and ask for grilled meats. Avoid extra toppings such as bacon, cheese, or fried items. Ask for the dressing on the side, or ask for olive oil and vinegar or lemon to use as dressing. Estimate how many servings of a food you are given. Knowing serving sizes will help you be aware of how much food you are eating at restaurants. Where to find more information Centers for Disease Control and Prevention: www.cdc.gov [place] Department of Agriculture: myplate.gov Summary Calorie counting means keeping track of how many calories you eat and drink each day. If you eat fewer calories than your body needs, you should lose weight. A healthy amount of weight to lose per week is usually 1-2 lb (0.5-0.9 kg). This usually means reducing your daily calorie intake by 500-750 calories. The number of calories in a food can be found on a Nutrition Facts label. If a food does not have a Nutrition Facts label, try to look up the calories online or ask your dietitian for help. Use smaller plates, glasses, and bowls for smaller portions and to prevent overeating. Use your calories on foods and drinks that will fill you up and not leave you hungry shortly after a meal. This information is not intended to replace advice given to you by your health care provider. Make sure you discuss any questions you have with your health care provider. Document Revised: 01/18/2020 Document Reviewed: 01/18/2020 Elsevier Patient Education  2023 [employer] GUIDELINES FOR  LOW-CHOLESTEROL, LOW-TRIGLYCERIDE DIETS    FOODS TO USE   MEATS, FISH Choose lean meats (chicken, turkey, veal, and non-fatty cuts of beef  with excess fat trimmed; one serving = 3 oz of cooked meat). Also, fresh or frozen fish, canned fish packed in water, and shellfish (lobster, crabs, shrimp, and oysters). Limit use to no more than one serving of one of these per week. Shellfish are high in cholesterol but low in saturated fat and should be used sparingly. Meats and fish should be broiled (pan or   oven) or baked on a rack.  EGGS Egg substitutes and egg whites (use freely). Egg yolks (limit two per week).  FRUITS Eat three servings of fresh fruit per day (1 serving =  cup). Be sure to have at least one citrus fruit daily. Frozen and canned fruit with no sugar or syrup added may be used.  VEGETABLES Most vegetables are not limited (see next page). One dark-green (string beans, escarole) or one deep yellow (squash) vegetable is recommended daily. Cauliflower, broccoli, and celery, as well as potato skins, are recommended for their fiber content. (Fiber is associated with cholesterol reduction) It is preferable to steam vegetables, but they may be boiled, strained, or braised with polyunsaturated vegetable oil (see below).  BEANS Dried peas or beans (1 serving =  cup) may be used as a bread substitute.  NUTS Almonds, walnuts, and peanuts may be used sparingly  (1 serving = 1 Tablespoonful). Use pumpkin, sesame, or sunflower seeds.  BREADS, GRAINS One roll or one slice of whole grain or enriched bread may be used, or three soda crackers or four pieces of melba toast as a substitute. Spaghetti, rice or noodles ( cup) or  large ear of corn may be used as a bread substitute. In preparing these foods do not use butter or shortening, use soft margarine. Also use egg and sugar substitutes.  Choose high fiber grains, such as oats and whole wheat.  CEREALS Use  cup of hot cereal or  cup of cold cereal per day. Add a sugar substitute if desired, with 99% fat free or skim milk.  MILK PRODUCTS Always use 99% fat free or skim milk, dairy products such  as low fat cheeses (farmer's uncreamed diet cottage), low-fat yogurt, and powdered skim milk.  FATS, OILS Use soft (not stick) margarine; vegetable oils that are high in polyunsaturated fats (such as safflower, sunflower, soybean, corn, and cottonseed). Always refrigerate meat drippings to harden the fat and remove it before preparing gravies  DESSERTS, SNACKS Limit to two servings per day; substitute each serving for a bread/cereal serving: ice milk, water sherbet (1/4 cup); unflavored gelatin or gelatin flavored with sugar substitute (1/3 cup); pudding prepared with skim milk (1/2 cup); egg white souffls; unbuttered popcorn (1  cups). Substitute carob for chocolate.  BEVERAGES Fresh fruit juices (limit 4 oz per day); black coffee, plain or herbal teas; soft drinks with sugar substitutes; club soda, preferably salt-free; cocoa made with skim milk or nonfat dried milk and water (sugar substitute added if desired); clear broth. Alcohol: limit two servings per day (see second page).  MISCELLANEOUS  You may use the following freely: vinegar, spices, herbs, nonfat bouillon, mustard, Worcestershire sauce, soy sauce, flavoring essence.                  GUIDELINES FOR  LOW-CHOLESTEROL, LOW TRIGLYCERIDE DIETS    FOODS TO AVOID   MEATS, FISH Marbled beef, pork, bacon, sausage, and other pork products; fatty fowl (duck, goose); skin and fat of turkey and chicken; processed meats; luncheon meats (salami, bologna); frankfurters and fast-food hamburgers (theyre loaded with fat); organ meats (kidneys, liver); canned fish packed in oil.  EGGS Limit egg yolks to two per week.   FRUITS Coconuts (rich in saturated fats).  VEGETABLES Avoid avocados. Starchy vegetables (potatoes, corn, lima beans, dried peas, beans) may be used only if substitutes for a serving of bread or cereal. (Baked potato skin, however, is desirable for its fiber content.  BEANS Commercial baked beans with sugar   and/or pork added.   NUTS Avoid nuts.  Limit peanuts and walnuts to one tablespoonful per day.  BREADS, GRAINS Any baked goods with shortening and/or sugar. Commercial mixes with dried eggs and whole milk. Avoid sweet rolls, doughnuts, breakfast pastries (Danish), and sweetened packaged cereals (the added sugar converts readily to triglycerides).  MILK PRODUCTS Whole milk and whole-milk packaged goods; cream; ice cream; whole-milk puddings, yogurt, or cheeses; nondairy cream substitutes.  FATS, OILS Butter, lard, animal fats, bacon drippings, gravies, cream sauces as well as palm and coconut oils. All these are high in saturated fats. Examine labels on cholesterol free products for hydrogenated fats. (These are oils that have been hardened into solids and in the process have become saturated.)  DESSERTS, SNACKS Fried snack foods like potato chips; chocolate; candies in general; jams, jellies, syrups; whole- milk puddings; ice cream and milk sherbets; hydrogenated peanut butter.  BEVERAGES Sugared fruit juices and soft drinks; cocoa made with whole milk and/or sugar. When using alcohol (1 oz liquor, 5 oz beer, or 2  oz dry table wine per serving), one serving must be substituted for one bread or cereal serving (limit, two servings of alcohol per day).   SPECIAL NOTES    Remember that even non-limited foods should be used in moderation. While on a cholesterol-lowering diet, be sure to avoid animal fats and marbled meats. 3. While on a triglyceride-lowering diet, be sure to avoid sweets and to control the amount of carbohydrates you eat (starchy foods such as flour, bread, potatoes).While on a tri-glyceride-lowering diet, be sure to avoid sweets Buy a good low-fat cookbook, such as the one published by the American Heart Association. Consult your physician if you have any questions.               [facility] Low Glycemic Diet Plan   Low Glycemic Foods (20-49) Moderate Glycemic Foods (50-69) High  Glycemic Foods (70-100)      Breakfast Creals Breakfast Cereals Breakfast Cereals  All Bran All-Bran Fruit'n Oats   Bran Buds Bran Chex   Cheerios Corn chex    Fiber One Oatmeal (not instant)   Just Right Mini-Wheats   Corn Flakes Cream of Wheat    Oat Bran Special K Swiss Muesli   Grape Nuts Grape Nut Flakes      Grits Nutri-Grain    Fruits and fruit juice: Fruits Puffed Rice Puffed Wheat    (Limit to 1-2 Servings per day) Banana (under-ride) Dates   Rice Chex Rice Krispies    Apples Apricots (fresh/dried)   Figs Grapes   Shredded Wheat Team    Blackberries Blueberries   Kiwi Mango   Total     Cherries Cranberries   Oranges Raisins     Peaches Pears    Fruits  Plums Prunes   Fruit Juices Pineapple Watermelon    Grapefruit Raspberries   Cranberry Juice Orange Juice   Banana (over-ripe)     Strawberries Tangerines      Apple Juice Grapefruit Juice   Beans and Legumes Beverages  Tomato Juice    [place]-type baked beans Sodas, sweet tea, pineapple juice   Canned pinto, kidney, or navy beans   Beans and Legumes (fresh-cooked) Green peas Vegetables  Black-eyed peas Butter Beans    Potato, baked, boiled, fried, mashed  Chick peas Lentils   Vegetables French fries  Green beans Lima beans   Beets Carrots   Canned or frozen corn  Kidney beans Navy beans   Sweet potato Yam     Parsnips  Pinto beans Snow peas   Corn on the cob Winter squash      Non-starchy vegetables Grains Breads  Asparagus, avocado, broccoli, cabbage Cornmeal Rice, brown   Most breads (white and whole grain)  cauliflower, celery, cucumber, greens Rice, white Couscous   Bagels Bread sticks    lettuce, mushrooms, peppers, tomatoes  Bread stuffing Kaiser roll    okra, onions, spinach, summer squash Pasta Dinner rolls   Macaroni Pizza, cheese     Grains Ravioli, meat filled Spaghetti, white   Grains  Barley Bulgur    Rice, instant Tapioca, with milk    Rye Wild rice   Nuts    Cashews  Macadamia   Candy and most cookies  Nuts and oils    Almonds, peanuts, sunflower seeds Snacks Snacks  hazelnuts, pecans, walnuts Chocolate Ice cream, lowfat   Donuts Corn chips    Oils that are liquid at room temperature Muffin Popcorn   Jelly beans Pretzels      Pastries  Dairy, fish, meat, soy, and eggs    Milk, skim Lowfat cheese    Restaurant and ethnic foods  Yogurt, lowfat, fruit sugar sweetened  Most Chinese food (sugar in stir fry    or wok sauce)  Lean red meat Fish    Teriyaki-style meats and vegetables  Skinless chicken and turkey, shellfish        Egg whites (up to 3 daily), Soy Products    Egg yolks (up to 7 or _____ per week)      

## 2022-12-31 NOTE — Progress Notes (Signed)
Date:  12/31/2022   Name:  Brendan Ball   DOB:  15-Feb-1996   MRN:  [id number]   Chief Complaint: Employment Physical (Life insurance policy ) and Gastroesophageal Reflux  HPI  Lab Results  Component Value Date   NA 135 12/02/2020   K 4.0 12/02/2020   CO2 22 12/02/2020   GLUCOSE 81 12/02/2020   BUN 20 12/02/2020   CREATININE 0.95 12/02/2020   CALCIUM 8.6 (L) 12/02/2020   GFRNONAA >60 12/02/2020   No results found for: "CHOL", "HDL", "LDLCALC", "LDLDIRECT", "TRIG", "CHOLHDL" No results found for: "TSH" Lab Results  Component Value Date   HGBA1C 5.3 07/04/2019   Lab Results  Component Value Date   WBC 14.2 (H) 11/26/2021   HGB 15.9 11/26/2021   HCT 45.7 11/26/2021   MCV 86.7 11/26/2021   PLT 206 11/26/2021   Lab Results  Component Value Date   ALT 92 (H) 12/02/2020   AST 42 (H) 12/02/2020   ALKPHOS 96 12/02/2020   BILITOT 0.6 12/02/2020   No results found for: "25OHVITD2", "25OHVITD3", "VD25OH"   Review of Systems  Constitutional:  Negative for chills and fever.  HENT:  Negative for drooling, ear discharge, ear pain and sore throat.   Respiratory:  Negative for cough, shortness of breath and wheezing.   Cardiovascular:  Negative for chest pain, palpitations and leg swelling.  Gastrointestinal:  Negative for abdominal pain, blood in stool, constipation, diarrhea and nausea.  Endocrine: Negative for polydipsia and polyuria.  Genitourinary:  Negative for dysuria and frequency.  Musculoskeletal:  Negative for back pain, myalgias and neck pain.  Skin:  Negative for rash.  Allergic/Immunologic: Negative for environmental allergies.  Neurological:  Negative for dizziness and headaches.  Hematological:  Does not bruise/bleed easily.  Psychiatric/Behavioral:  Negative for suicidal ideas. The patient is not nervous/anxious.     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   Gastric erythema    Ileitis    Abdominal pain  12/17/2020   Diarrhea 12/17/2020   Elevated CK 12/16/2020   Elevated LFTs 12/16/2020   Epigastric pain 12/16/2020   Leukocytosis 12/02/2020   Gastroesophageal reflux disease 09/23/2020   Intractable migraine with aura without status migrainosus 09/06/2019   Hearing loss, sensorineural 11/02/2012   Fracture of scapula 10/31/2012   Fracture of orbit ([place]) 10/12/2012   Hemorrhage into subarachnoid space of neuraxis ([place]) 10/12/2012   Fracture of temporal bone ([place]) 10/12/2012   Fracture of thoracic spine ([place]) 10/12/2012    Allergies  Allergen Reactions   Ibuprofen Anaphylaxis   Sulfa Antibiotics Other (See Comments)    Past Surgical History:  Procedure Laterality Date   COLONOSCOPY WITH PROPOFOL N/A 12/25/2020   Procedure: COLONOSCOPY WITH PROPOFOL;  Surgeon: [name], MD;  Location: [facility] ENDOSCOPY;  Service: Endoscopy;  Laterality: N/A;   ESOPHAGOGASTRODUODENOSCOPY (EGD) WITH PROPOFOL N/A 12/25/2020   Procedure: ESOPHAGOGASTRODUODENOSCOPY (EGD) WITH PROPOFOL;  Surgeon: [name], MD;  Location: [facility] ENDOSCOPY;  Service: Endoscopy;  Laterality: N/A;   KNEE ARTHROSCOPY W/ MENISCAL REPAIR  2018   KNEE ARTHROSCOPY WITH ANTERIOR CRUCIATE LIGAMENT (ACL) REPAIR  2018    Social History   Tobacco Use   Smoking status: Every Day    Packs/day: 0.50    Years: 10.00    Total pack years: 5.00    Types: Cigarettes   Smokeless tobacco: Former    Types: Snuff    Quit date: 2014  Vaping Use   Vaping Use:  Never used  Substance Use Topics   Alcohol use: No    Alcohol/week: 0.0 standard drinks of alcohol    Comment: occasional   Drug use: Yes    Types: Marijuana    Comment: 2 joints/day     Medication list has been reviewed and updated.  Current Meds  Medication Sig   pantoprazole (PROTONIX) 40 MG tablet Take 1 tablet (40 mg total) by mouth daily.       12/31/2022    2:54 PM 06/29/2022    3:50 PM 09/23/2020    2:39 PM  GAD 7 : Generalized Anxiety Score   Nervous, Anxious, on Edge 0 0 0  Control/stop worrying 0 0 0  Worry too much - different things 0 0 0  Trouble relaxing 0 0 0  Restless 0 0 0  Easily annoyed or irritable 0 0 0  Afraid - awful might happen 0 0 0  Total GAD 7 Score 0 0 0  Anxiety Difficulty Not difficult at all Not difficult at all Not difficult at all       12/31/2022    2:54 PM 06/29/2022    3:50 PM 09/23/2020    2:39 PM  Depression screen PHQ 2/9  Decreased Interest 0 0 0  Down, Depressed, Hopeless 0 0 0  PHQ - 2 Score 0 0 0  Altered sleeping 0 0 3  Tired, decreased energy 0 0 0  Change in appetite 0 0 0  Feeling bad or failure about yourself  0 0 0  Trouble concentrating 0 0 0  Moving slowly or fidgety/restless 0 0 0  Suicidal thoughts 0 0 0  PHQ-9 Score 0 0 3  Difficult doing work/chores Not difficult at all Not difficult at all Not difficult at all    BP Readings from Last 3 Encounters:  12/31/22 124/76  06/29/22 130/72  11/26/21 109/90    Physical Exam Vitals and nursing note reviewed.  HENT:     Head: Normocephalic.     Right Ear: Tympanic membrane and ear canal normal.     Left Ear: Tympanic membrane and ear canal normal.     Nose: Nose normal.     Mouth/Throat:     Mouth: Mucous membranes are moist.  Cardiovascular:     Rate and Rhythm: Normal rate and regular rhythm.     Heart sounds: No murmur heard.    No friction rub. No gallop.  Pulmonary:     Breath sounds: No wheezing, rhonchi or rales.  Abdominal:     Tenderness: There is no abdominal tenderness.  Musculoskeletal:     Cervical back: Neck supple.  Neurological:     Mental Status: He is alert.     Wt Readings from Last 3 Encounters:  12/31/22 (!) 311 lb (141.1 kg)  06/29/22 (!) 305 lb (138.3 kg)  11/26/21 290 lb (131.5 kg)    BP 124/76   Pulse 92   Ht 6\' 1"  (1.854 m)   Wt (!) 311 lb (141.1 kg)   SpO2 98%   BMI 41.03 kg/m   Assessment and Plan: Patient is a 27 year old male who presents for life insurance  physical exam exam. The patient reports the following problems: gerd. Health maintenance has been reviewed up-to-date  1. Steatosis of liver Most recent CT scan for ruling out renal stone noted severe steatosis of the liver on 12/03/2022 when patient was evaluated for abdominal pain.  I do not see any lipid panel that has been done  and we will proceed with evaluation of that and we have contacted gastroenterology and patient has an appointment with Dr. Marius Ditch later this month for discussion of status of the liver.  Pathology that was done noted also some evidence of steatohepatitis so there is some inflammation suggested and I am wondering if this may be contributing to problem #4. - Lipid panel  2. Gastroesophageal reflux disease without esophagitis chronic.  Controlled.  Stable.  Symptomatology is controlled on pantoprazole 40 mg once a day. - pantoprazole (PROTONIX) 40 MG tablet; Take 1 tablet (40 mg total) by mouth daily.  Dispense: 90 tablet; Refill: 1  3. BMI 40.0-44.9, adult [facility]) Health risks of being over weight were discussed and patient was counseled on weight loss options and exercise.   4. Leukocytosis, unspecified type Patient was in the process of being worked up by hematology for leukocytosis and multiple studies were done.  I do not know what the endpoint of this is yet but we will repeat CBC to see what the current level of white blood cell count and differential is. - CBC  5. Elevated LFTs Looking back patient's had elevated LFTs and elevated alk phos in the past with not necessarily elevated bilirubin.  We will repeat a CMP urinalysis notes no urobilinogen. - Comprehensive metabolic panel - POCT Urinalysis Dipstick  6. Tobacco dependence Patient was a premature baby and a was some suggestion of lung concern of development.  However patient is a smoker and we have discussed this with him and that the absolute cessation is is in his best interest.Patient has been advised of the  health risks of smoking and counseled concerning cessation of tobacco products. I spent over 3 minutes for discussion and to answer questions.   7. Need for immunization against influenza Discussed and administered - Flu Vaccine QUAD 32mo+IM (Fluarix, Fluzone & Alfiuria Quad PF)   Otilio Miu, MD

## 2023-01-01 LAB — LIPID PANEL
Chol/HDL Ratio: 5.2 ratio — ABNORMAL HIGH (ref 0.0–5.0)
Cholesterol, Total: 178 mg/dL (ref 100–199)
HDL: 34 mg/dL — ABNORMAL LOW (ref >39)
LDL Chol Calc (NIH): 104 mg/dL — ABNORMAL HIGH (ref 0–99)
Triglycerides: 230 mg/dL — ABNORMAL HIGH (ref 0–149)
VLDL Cholesterol Cal: 40 mg/dL (ref 5–40)

## 2023-01-01 LAB — CBC
Hematocrit: 45 % (ref 37.5–51.0)
Hemoglobin: 15.4 g/dL (ref 13.0–17.7)
MCH: 30 pg (ref 26.6–33.0)
MCHC: 34.2 g/dL (ref 31.5–35.7)
MCV: 88 fL (ref 79–97)
Platelets: 224 10*3/uL (ref 150–450)
RBC: 5.14 x10E6/uL (ref 4.14–5.80)
RDW: 12.2 % (ref 11.6–15.4)
WBC: 14.5 10*3/uL — ABNORMAL HIGH (ref 3.4–10.8)

## 2023-01-01 LAB — POCT URINALYSIS DIPSTICK
Bilirubin, UA: NEGATIVE
Blood, UA: NEGATIVE
Glucose, UA: NEGATIVE
Ketones, UA: NEGATIVE
Leukocytes, UA: NEGATIVE
Nitrite, UA: NEGATIVE
Protein, UA: NEGATIVE
Spec Grav, UA: 1.015 (ref 1.010–1.025)
Urobilinogen, UA: 0.2 E.U./dL
pH, UA: 5 (ref 5.0–8.0)

## 2023-01-01 LAB — COMPREHENSIVE METABOLIC PANEL
ALT: 72 IU/L — ABNORMAL HIGH (ref 0–44)
AST: 40 IU/L (ref 0–40)
Albumin/Globulin Ratio: 1.6 (ref 1.2–2.2)
Albumin: 4.6 g/dL (ref 4.3–5.2)
Alkaline Phosphatase: 125 IU/L — ABNORMAL HIGH (ref 44–121)
BUN/Creatinine Ratio: 16 (ref 9–20)
BUN: 16 mg/dL (ref 6–20)
Bilirubin Total: 0.3 mg/dL (ref 0.0–1.2)
CO2: 19 mmol/L — ABNORMAL LOW (ref 20–29)
Calcium: 9.4 mg/dL (ref 8.7–10.2)
Chloride: 104 mmol/L (ref 96–106)
Creatinine, Ser: 0.97 mg/dL (ref 0.76–1.27)
Globulin, Total: 2.9 g/dL (ref 1.5–4.5)
Glucose: 99 mg/dL (ref 70–99)
Potassium: 3.9 mmol/L (ref 3.5–5.2)
Sodium: 139 mmol/L (ref 134–144)
Total Protein: 7.5 g/dL (ref 6.0–8.5)
eGFR: 109 mL/min/{1.73_m2} (ref >59)

## 2023-01-08 ENCOUNTER — Other Ambulatory Visit: Payer: Self-pay

## 2023-01-14 ENCOUNTER — Ambulatory Visit (INDEPENDENT_AMBULATORY_CARE_PROVIDER_SITE_OTHER): Payer: 59 | Admitting: Gastroenterology

## 2023-01-14 ENCOUNTER — Encounter: Payer: Self-pay | Admitting: Gastroenterology

## 2023-01-14 VITALS — BP 128/76 | HR 77 | Temp 97.8°F | Ht 73.0 in | Wt 301.2 lb

## 2023-01-14 DIAGNOSIS — K760 Fatty (change of) liver, not elsewhere classified: Secondary | ICD-10-CM | POA: Diagnosis not present

## 2023-01-14 DIAGNOSIS — K219 Gastro-esophageal reflux disease without esophagitis: Secondary | ICD-10-CM

## 2023-01-14 DIAGNOSIS — K7581 Nonalcoholic steatohepatitis (NASH): Secondary | ICD-10-CM | POA: Diagnosis not present

## 2023-01-14 NOTE — Progress Notes (Signed)
Cephas Darby, MD [address]  [place]  [place], [place] [address]  Main: [phone]  Fax: [phone]    Gastroenterology Consultation  Referring Provider:     [name], MD Primary Care Physician:  [name], MD Primary Gastroenterologist:  Dr. Bonna Gains Reason for Consultation: Fatty liver, chronic GERD        HPI:   Contrell Ballentine is a 27 y.o. male referred by Dr. [name], MD  for consultation & management of fatty liver, chronic GERD.  Patient is here for follow-up of fatty liver and chronic GERD.  Patient is accompanied by his mom and his wife.  Patient has been following Mediterranean diet as recommended by Dr. Ronnald Ramp, started about 2 weeks ago, has started working out about a week ago.  He reports feeling better and his reflux symptoms are under control.  He is currently taking Protonix 40 mg once a day his wife had questions regarding fatty liver and liver biopsy findings.  Patient underwent comprehensive workup for his elevated LFTs, underwent liver biopsy in 2022, found to have severe steatohepatitis with no evidence of underlying fibrosis.  He had elevated anti-smooth muscle antibodies.  He smokes tobacco, cut down to half pack per day Occasional alcohol use  NSAIDs: None  Antiplts/Anticoagulants/Anti thrombotics: None  GI Procedures:  EGD and colonoscopy 12/25/2020 DIAGNOSIS: A. DUODENUM; COLD BIOPSY: - ENTERIC MUCOSA WITH PRESERVED VILLOUS ARCHITECTURE AND NO SIGNIFICANT HISTOPATHOLOGIC CHANGE. - NEGATIVE FOR FEATURES OF CELIAC, DYSPLASIA, AND MALIGNANCY.  B. STOMACH; COLD BIOPSY: - GASTRIC ANTRAL AND OXYNTIC MUCOSA WITH FEATURES OF MILD REACTIVE GASTROPATHY. - NEGATIVE FOR H. PYLORI, DYSPLASIA, AND MALIGNANCY.  C. GASTROESOPHAGEAL JUNCTION; COLD BIOPSY: - SQUAMOCOLUMNAR MUCOSA WITH NO SIGNIFICANT HISTOPATHOLOGIC CHANGE. - NEGATIVE FOR INTESTINAL METAPLASIA, DYSPLASIA, AND MALIGNANCY.  D. TERMINAL ILEUM; COLD BIOPSY: - ENTERIC  MUCOSA WITH NORMAL VILLOUS ARCHITECTURE AND UNREMARKABLE PEYER PATCHES. - MINUTE FOCUS OF ACTIVE MUCOSAL ILEITIS. - NEGATIVE FOR DYSPLASIA AND MALIGNANCY.    E. COLON, RANDOM; COLD BIOPSY:  - BENIGN COLONIC MUCOSA WITH NO SIGNIFICANT HISTOPATHOLOGIC CHANGE.  - NEGATIVE FOR FEATURES OF MICROSCOPIC COLITIS AND ARCHITECTURAL CHANGES  OF CHRONIC COLITIS.  - NEGATIVE FOR DYSPLASIA AND MALIGNANCY.    11/26/2021, ultrasound-guided liver biopsy DIAGNOSIS:  A. LIVER, RIGHT LOBE; ULTRASOUND-GUIDED BIOPSY:  - SEVERE STEATOSIS WITH FEATURES OF STEATOHEPATITIS, NAFLD ACTIVITY  SCORE 6 OF 8.  - FIBROSIS IS NOT IDENTIFIED.   Past Medical History:  Diagnosis Date   GERD (gastroesophageal reflux disease)    Headache    Orbital fracture ([facility])    SAH (subarachnoid hemorrhage) ([facility])    Scapula fracture    Temporal bone fracture ([place])    Thoracic spine fracture [facility])     Past Surgical History:  Procedure Laterality Date   COLONOSCOPY WITH PROPOFOL N/A 12/25/2020   Procedure: COLONOSCOPY WITH PROPOFOL;  Surgeon: [name], MD;  Location: [facility] ENDOSCOPY;  Service: Endoscopy;  Laterality: N/A;   ESOPHAGOGASTRODUODENOSCOPY (EGD) WITH PROPOFOL N/A 12/25/2020   Procedure: ESOPHAGOGASTRODUODENOSCOPY (EGD) WITH PROPOFOL;  Surgeon: [name], MD;  Location: [facility] END[facility]COPY;  Service: Endoscopy;  Laterality: N/A;   KNEE ARTHROSCOPY W/ MENISCAL REPAIR  2018   KNEE ARTHROSCOPY WITH ANTERIOR CRUCIATE LIGAMENT (ACL) REPAIR  2018     Current Outpatient Medications:    gabapentin (NEURONTIN) 100 MG capsule, Take 100 mg by mouth 2 (two) times daily., Disp: , Rfl:    ondansetron (ZOFRAN-ODT) 4 MG disintegrating tablet, Take 4 mg by mouth every 8 (  eight) hours as needed., Disp: , Rfl:    pantoprazole (PROTONIX) 40 MG tablet, Take 1 tablet (40 mg total) by mouth daily., Disp: 90 tablet, Rfl: 1   Family History  Problem Relation Age of Onset   Diabetes Mother    Hypertension Father    Cancer  Maternal Grandmother    Diabetes Maternal Grandfather    Heart disease Paternal Grandfather      Social History   Tobacco Use   Smoking status: Every Day    Packs/day: 0.50    Years: 10.00    Total pack years: 5.00    Types: Cigarettes   Smokeless tobacco: Former    Types: Snuff    Quit date: 2014  Vaping Use   Vaping Use: Never used  Substance Use Topics   Alcohol use: No    Alcohol/week: 0.0 standard drinks of alcohol    Comment: occasional   Drug use: Yes    Types: Marijuana    Comment: 2 joints/day    Allergies as of 01/14/2023 - Review Complete 01/14/2023  Allergen Reaction Noted   Ibuprofen Anaphylaxis 05/04/2016   Sulfa antibiotics Other (See Comments) 05/04/2016    Review of Systems:    All systems reviewed and negative except where noted in HPI.   Physical Exam:  BP 128/76 (BP Location: Left Arm, Patient Position: Sitting, Cuff Size: Normal)   Pulse 77   Temp 97.8 F (36.6 C) (Oral)   Ht 6\' 1"  (1.854 m)   Wt (!) 301 lb 4 oz (136.6 kg)   BMI 39.75 kg/m  No LMP for male patient.  General:   Alert,  Well-developed, well-nourished, pleasant and cooperative in NAD Head:  Normocephalic and atraumatic. Eyes:  Sclera clear, no icterus.   Conjunctiva pink. Ears:  Normal auditory acuity. Nose:  No deformity, discharge, or lesions. Mouth:  No deformity or lesions,oropharynx pink & moist. Neck:  Supple; no masses or thyromegaly. Lungs:  Respirations even and unlabored.  Clear throughout to auscultation.   No wheezes, crackles, or rhonchi. No acute distress. Heart:  Regular rate and rhythm; no murmurs, clicks, rubs, or gallops. Abdomen:  Normal bowel sounds. Soft, non-tender and non-distended without masses, hepatosplenomegaly or hernias noted.  No guarding or rebound tenderness.   Rectal: Not performed Msk:  Symmetrical without gross deformities. Good, equal movement & strength bilaterally. Pulses:  Normal pulses noted. Extremities:  No clubbing or edema.  No  cyanosis. Neurologic:  Alert and oriented x3;  grossly normal neurologically. Skin:  Intact without significant lesions or rashes. No jaundice. Psych:  Alert and cooperative. Normal mood and affect.  Imaging Studies: Reviewed  Assessment and Plan:   Elsworth Ledin is a 27 y.o. pleasant Caucasian male with obesity, BMI 39.75, biopsy-proven MASH, chronic GERD on a PPI  Biopsy-proven MASH Patient has adopted healthy lifestyle with Mediterranean diet and exercise His LFTs have mildly improved Check [place] fibrosis panel Medication starting pipeline for treatment of MASH  Chronic GERD On Protonix 40 mg daily before breakfast, advised patient that he can try low-dose Protonix Continue antireflux lifestyle   Follow up in 6 months   Cephas Darby, MD

## 2023-01-16 LAB — NASH FIBROSURE(R) PLUS
ALPHA 2-MACROGLOBULINS, QN: 172 mg/dL (ref 110–276)
ALT (SGPT) P5P: 103 IU/L — ABNORMAL HIGH (ref 0–55)
AST (SGOT) P5P: 54 IU/L — ABNORMAL HIGH (ref 0–40)
Apolipoprotein A-1: 89 mg/dL — ABNORMAL LOW (ref 101–178)
Bilirubin, Total: 0.3 mg/dL (ref 0.0–1.2)
Cholesterol, Total: 172 mg/dL (ref 100–199)
Fibrosis Score: 0.11 (ref 0.00–0.21)
GGT: 33 IU/L (ref 0–65)
Glucose: 80 mg/dL (ref 70–99)
Haptoglobin: 219 mg/dL (ref 17–317)
NASH Score: 0.5 — ABNORMAL HIGH (ref 0.00–0.25)
Steatosis Score: 0.52 — ABNORMAL HIGH (ref 0.00–0.40)
Triglycerides: 138 mg/dL (ref 0–149)

## 2023-01-18 ENCOUNTER — Encounter: Payer: Self-pay | Admitting: Gastroenterology

## 2023-04-16 IMAGING — US US BIOPSY CORE LIVER
1 series · 6 of 6 positions shown · non-contrast
Comparison: Right upper quadrant abdominal ultrasound-10/28/2021

INDICATION: Elevated LFTs of uncertain etiology. Please perform
ultrasound-guided liver biopsy for tissue diagnostic purposes.

EXAM:
ULTRASOUND GUIDED LIVER BIOPSY

[Series 1: us biopsy core liver · 0.30mm/px · 6 of 6 slices shown]
[im 1/6]
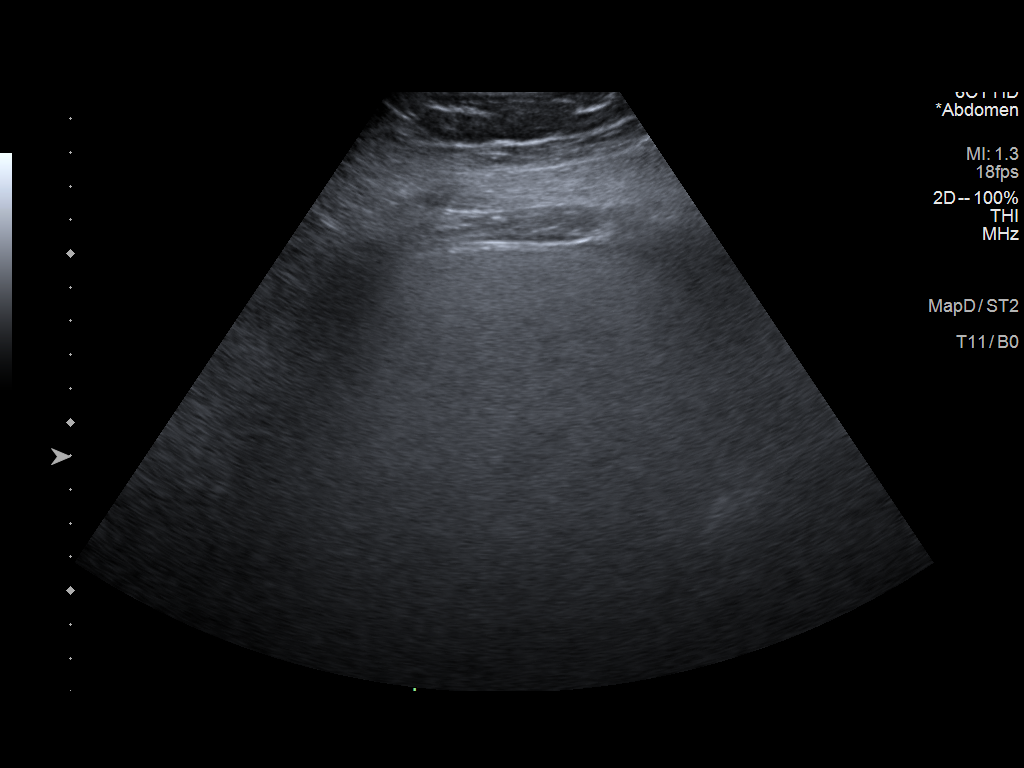
[im 2/6]
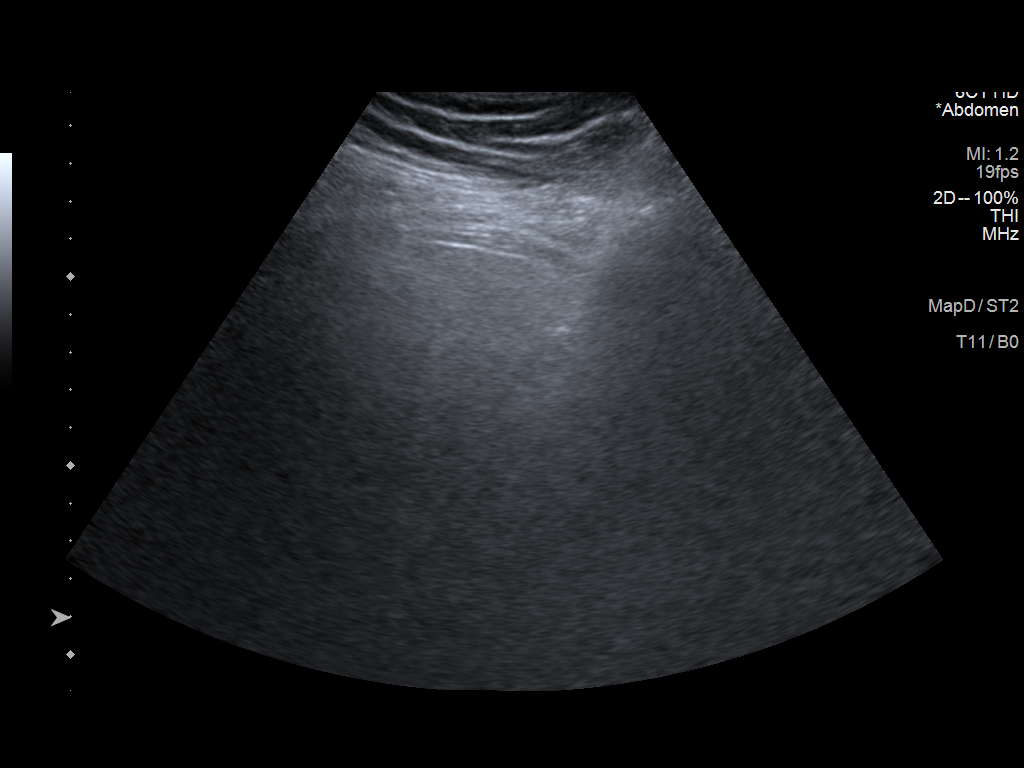
[im 3/6]
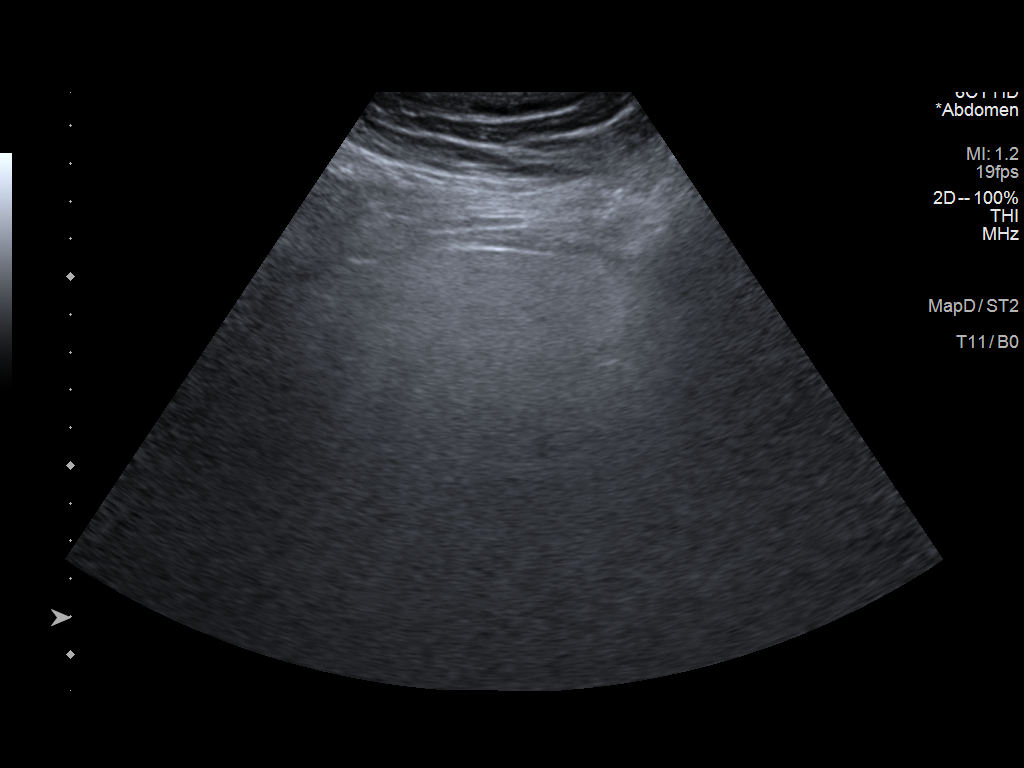
[im 4/6]
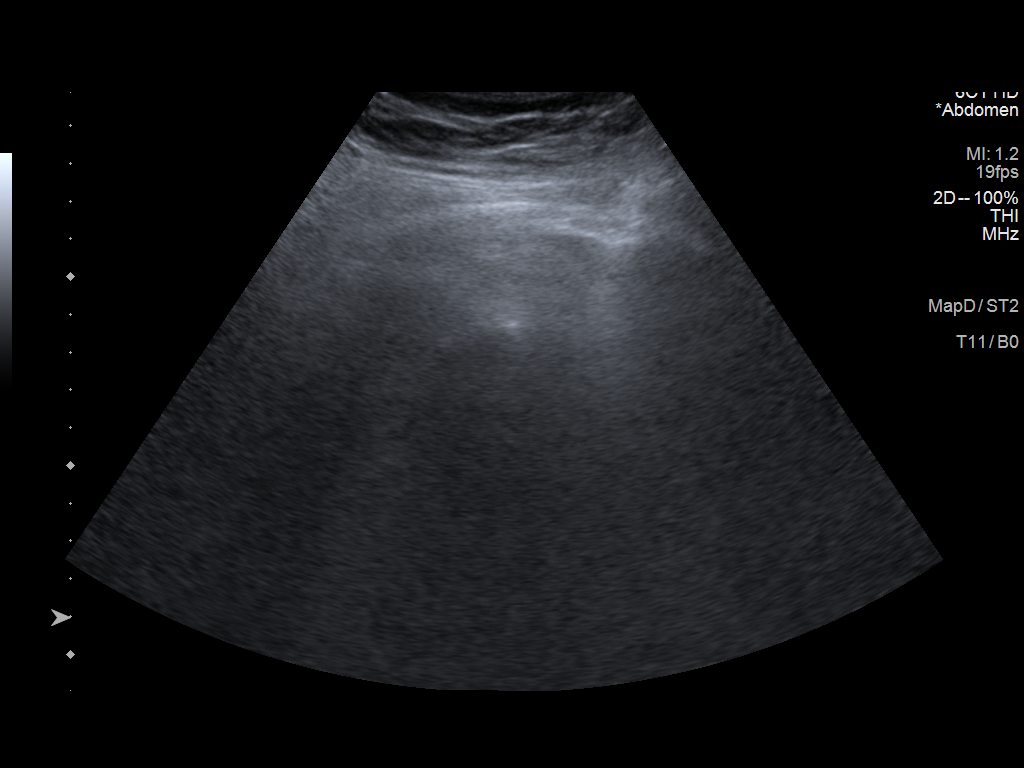
[im 5/6]
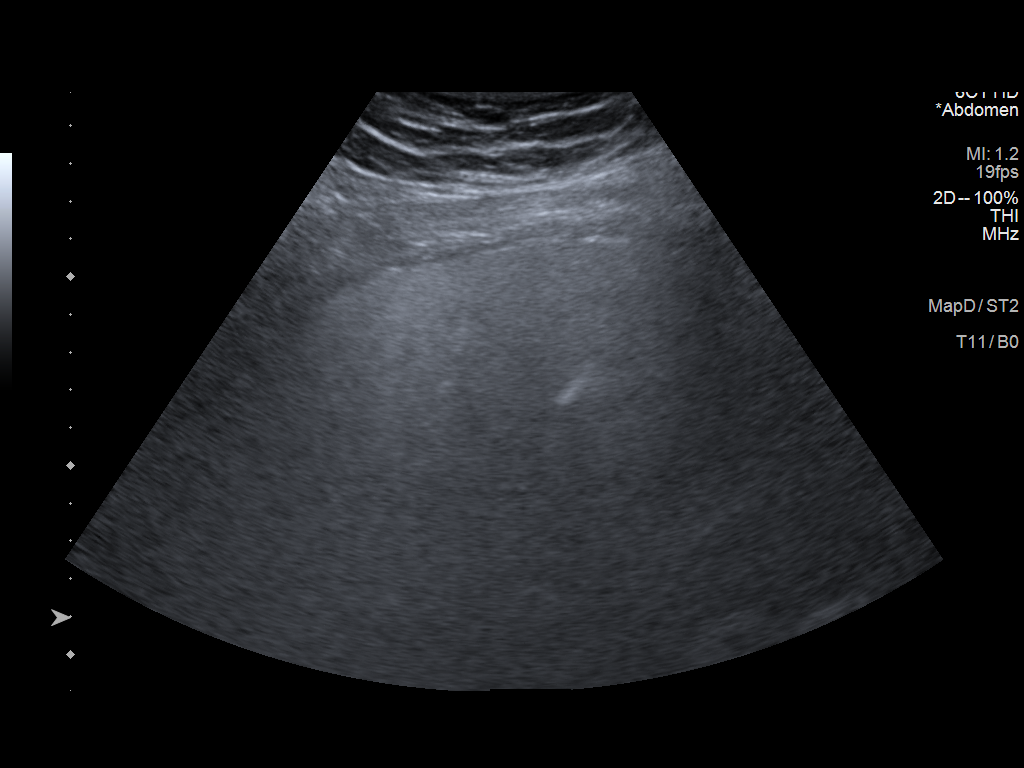
[im 6/6]
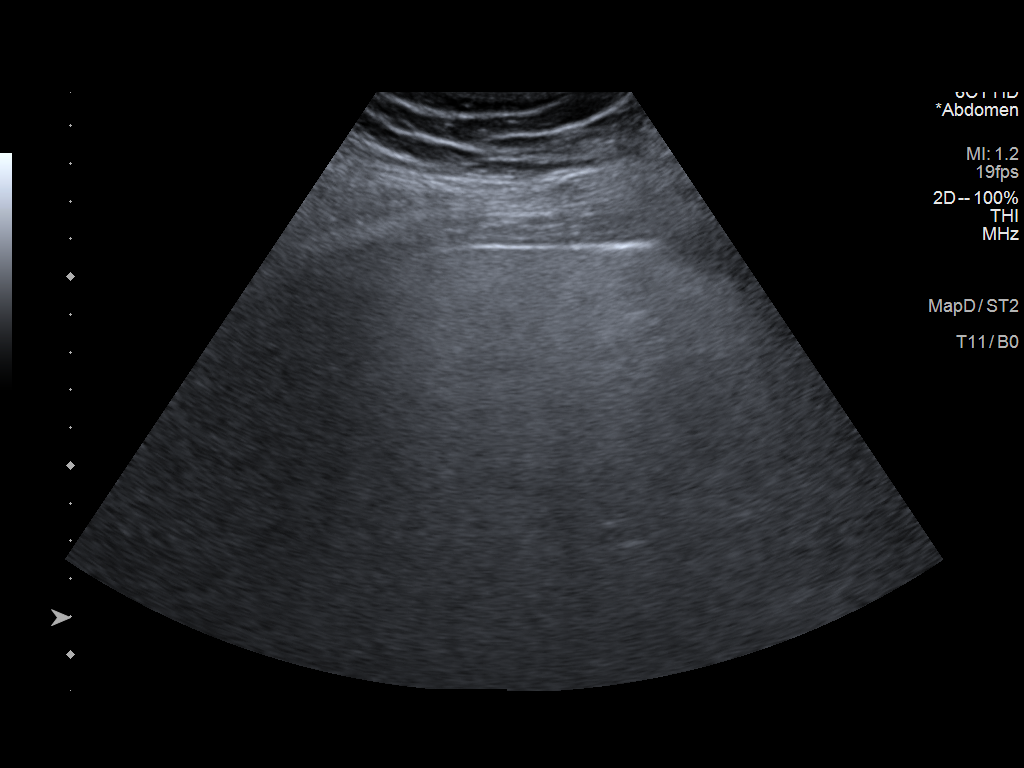

[6 of 6 positions shown; findings below may reference images not displayed]

MEDICATIONS:
None

ANESTHESIA/SEDATION:
Fentanyl 100 mcg IV; Versed 2 mg IV

Total Moderate Sedation time: 11 minutes; The patient was
continuously monitored during the procedure by the interventional
radiology nurse under my direct supervision.

COMPLICATIONS:
None immediate.

PROCEDURE:
Informed written consent was obtained from the patient after a
discussion of the risks, benefits and alternatives to treatment. The
patient understands and consents the procedure. A timeout was
performed prior to the initiation of the procedure.

Ultrasound scanning was performed of the right upper abdominal
quadrant and the procedure was planned. The right upper abdomen was
prepped and draped in the usual sterile fashion. The overlying soft
tissues were anesthetized with 1% lidocaine with epinephrine. A 17
gauge, 6.8 cm co-axial needle was advanced into a peripheral aspect
of the right lobe of the liver and 3 core biopsies were obtained
with an 18 gauge core device under direct ultrasound guidance.

The co-axial needle track was embolized with the administration of a
Gel-Foam slurry. Superficial hemostasis was obtained with manual
compression. Post procedural scanning was negative for definitive
area of hemorrhage. A dressing was placed. The patient tolerated the
procedure well without immediate post procedural complication.
IMPRESSION: Technically successful ultrasound guided liver biopsy.

## 2023-07-01 ENCOUNTER — Ambulatory Visit (INDEPENDENT_AMBULATORY_CARE_PROVIDER_SITE_OTHER): Payer: 59 | Admitting: Family Medicine

## 2023-07-01 ENCOUNTER — Encounter: Payer: Self-pay | Admitting: Family Medicine

## 2023-07-01 ENCOUNTER — Ambulatory Visit
Admission: RE | Admit: 2023-07-01 | Discharge: 2023-07-01 | Disposition: A | Discharge status: Home / Self Care | Payer: 59 | Source: Ambulatory Visit | Attending: Family Medicine | Admitting: Family Medicine

## 2023-07-01 ENCOUNTER — Ambulatory Visit
Admission: RE | Admit: 2023-07-01 | Discharge: 2023-07-01 | Disposition: A | Discharge status: Home / Self Care | Payer: 59 | Attending: Family Medicine | Admitting: Family Medicine

## 2023-07-01 VITALS — BP 128/70 | HR 80 | Ht 73.0 in | Wt 265.0 lb

## 2023-07-01 DIAGNOSIS — M545 Low back pain, unspecified: Secondary | ICD-10-CM | POA: Diagnosis not present

## 2023-07-01 DIAGNOSIS — Z8781 Personal history of (healed) traumatic fracture: Secondary | ICD-10-CM

## 2023-07-01 DIAGNOSIS — K219 Gastro-esophageal reflux disease without esophagitis: Secondary | ICD-10-CM

## 2023-07-01 MED ORDER — PREDNISONE 50 MG PO TABS: ORAL_TABLET | ORAL | 0 refills | Status: DC

## 2023-07-01 MED ORDER — METHOCARBAMOL 500 MG PO TABS: 500.0000 mg | ORAL_TABLET | Freq: Four times a day (QID) | ORAL | 0 refills | Status: AC

## 2023-07-01 MED ORDER — PANTOPRAZOLE SODIUM 40 MG PO TBEC: 40.0000 mg | DELAYED_RELEASE_TABLET | Freq: Every day | ORAL | 1 refills | Status: DC
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
## 2023-07-07 ENCOUNTER — Other Ambulatory Visit: Payer: Self-pay

## 2023-07-07 DIAGNOSIS — S22009D Unspecified fracture of unspecified thoracic vertebra, subsequent encounter for fracture with routine healing: Secondary | ICD-10-CM

## 2023-07-07 DIAGNOSIS — M545 Low back pain, unspecified: Secondary | ICD-10-CM

## 2023-08-10 ENCOUNTER — Other Ambulatory Visit: Payer: Self-pay

## 2023-08-10 ENCOUNTER — Ambulatory Visit: Payer: 59 | Admitting: Student in an Organized Health Care Education/Training Program

## 2023-08-10 ENCOUNTER — Telehealth: Payer: Self-pay

## 2023-08-10 DIAGNOSIS — F419 Anxiety disorder, unspecified: Secondary | ICD-10-CM

## 2023-08-10 NOTE — Transitions of Care (Post Inpatient/ED Visit) (Signed)
   08/10/2023  Name: Brendan Ball MRN: [id number] DOB: Apr 04, 1996  Today's TOC FU Call Status: Today's TOC FU Call Status:: Successful TOC FU Call Completed TOC FU Call Complete Date: 08/10/23  Transition Care Management Follow-up Telephone Call Date of Discharge: 08/09/23 Discharge Facility: Other ([facility]) Name of Other ([facility]) Discharge Facility: [facility] Type of Discharge: Inpatient Admission Primary Inpatient Discharge Diagnosis:: convulsions How have you been since you were released from the hospital?: Better Any questions or concerns?: No  Items Reviewed: Did you receive and understand the discharge instructions provided?: Yes Medications obtained,verified, and reconciled?: Yes (Medications Reviewed) Any new allergies since your discharge?: No Dietary orders reviewed?: Yes Do you have support at home?: No  Medications Reviewed Today: Medications Reviewed Today     Reviewed by Karena Addison, LPN (Licensed Practical Nurse) on 08/10/23 at 1342  Med List Status: <None>   Medication Order Taking? Sig Documenting Provider Last Dose Status Informant  gabapentin (NEURONTIN) 100 MG capsule [id number] No Take 100 mg by mouth 2 (two) times daily. [provider] Taking Active   methocarbamol (ROBAXIN) 500 MG tablet [id number]  Take 1 tablet (500 mg total) by mouth 4 (four) times daily. Duanne Limerick, MD  Active   ondansetron (ZOFRAN-ODT) 4 MG disintegrating tablet [id number] No Take 4 mg by mouth every 8 (eight) hours as needed. [provider] Taking Active   pantoprazole (PROTONIX) 40 MG tablet [id number]  Take 1 tablet (40 mg total) by mouth daily. Duanne Limerick, MD  Active   predniSONE (DELTASONE) 50 MG tablet [id number]  One q day Duanne Limerick, MD  Active             Home Care and Equipment/Supplies: Were Home Health Services Ordered?: NA Any new equipment or medical supplies ordered?: NA  Functional Questionnaire: Do you need assistance  with bathing/showering or dressing?: No Do you need assistance with meal preparation?: No Do you need assistance with eating?: No Do you have difficulty maintaining continence: No Do you need assistance with getting out of bed/getting out of a chair/moving?: No Do you have difficulty managing or taking your medications?: No  Follow up appointments reviewed: PCP Follow-up appointment confirmed?: NA Specialist Hospital Follow-up appointment confirmed?: No Reason Specialist Follow-Up Not Confirmed: Patient has Specialist Provider Number and will Call for Appointment Do you need transportation to your follow-up appointment?: No Do you understand care options if your condition(s) worsen?: Yes-patient verbalized understanding    SIGNATURE Karena Addison, LPN [facility] Nurse Health Advisor Direct Dial [phone]

## 2023-08-24 ENCOUNTER — Ambulatory Visit: Payer: 59 | Admitting: Student in an Organized Health Care Education/Training Program

## 2023-10-05 ENCOUNTER — Ambulatory Visit: Payer: 59 | Admitting: Student in an Organized Health Care Education/Training Program

## 2023-12-30 ENCOUNTER — Ambulatory Visit: Payer: 59 | Admitting: Family Medicine

## 2024-01-06 ENCOUNTER — Ambulatory Visit: Payer: 59 | Admitting: Family Medicine

## 2024-01-17 ENCOUNTER — Ambulatory Visit (INDEPENDENT_AMBULATORY_CARE_PROVIDER_SITE_OTHER): Payer: BC Managed Care – PPO | Admitting: Family Medicine

## 2024-01-17 ENCOUNTER — Encounter: Payer: Self-pay | Admitting: Family Medicine

## 2024-01-17 VITALS — BP 120/76 | HR 95 | Ht 73.0 in | Wt 269.2 lb

## 2024-01-17 DIAGNOSIS — F17200 Nicotine dependence, unspecified, uncomplicated: Secondary | ICD-10-CM

## 2024-01-17 DIAGNOSIS — K760 Fatty (change of) liver, not elsewhere classified: Secondary | ICD-10-CM | POA: Diagnosis not present

## 2024-01-17 DIAGNOSIS — E785 Hyperlipidemia, unspecified: Secondary | ICD-10-CM

## 2024-01-17 DIAGNOSIS — K219 Gastro-esophageal reflux disease without esophagitis: Secondary | ICD-10-CM | POA: Diagnosis not present

## 2024-01-17 MED ORDER — PANTOPRAZOLE SODIUM 40 MG PO TBEC
40.0000 mg | DELAYED_RELEASE_TABLET | Freq: Every day | ORAL | 1 refills | Status: AC
Start: 2024-01-17 — End: 2024-07-15

## 2024-01-17 NOTE — Progress Notes (Signed)
Date:  01/17/2024   Name:  Brendan Ball   DOB:  [birth year]   MRN:  [id number]   Chief Complaint: Gastroesophageal Reflux (Patient presents today for a medication refill on his Protonix. )  Gastroesophageal Reflux He reports no abdominal pain, no belching, no chest pain, no choking, no coughing, no dysphagia, no early satiety, no globus sensation, no heartburn, no hoarse voice, no nausea, no sore throat, no stridor, no tooth decay, no water brash or no wheezing. This is a chronic problem. The current episode started more than 1 year ago. The problem occurs occasionally. The problem has been gradually improving. Nothing aggravates the symptoms. Pertinent negatives include no anemia, fatigue, melena, muscle weakness, orthopnea or weight loss. He has tried a PPI for the symptoms. The treatment provided moderate relief.    Lab Results  Component Value Date   NA 139 12/31/2022   K 3.9 12/31/2022   CO2 19 (L) 12/31/2022   GLUCOSE 99 12/31/2022   BUN 16 12/31/2022   CREATININE 0.97 12/31/2022   CALCIUM 9.4 12/31/2022   EGFR 109 12/31/2022   GFRNONAA >60 12/02/2020   Lab Results  Component Value Date   CHOL 172 01/14/2023   HDL 34 (L) 12/31/2022   LDLCALC 104 (H) 12/31/2022   TRIG 138 01/14/2023   CHOLHDL 5.2 (H) 12/31/2022   No results found for: "TSH" Lab Results  Component Value Date   HGBA1C 5.3 07/04/2019   Lab Results  Component Value Date   WBC 14.5 (H) 12/31/2022   HGB 15.4 12/31/2022   HCT 45.0 12/31/2022   MCV 88 12/31/2022   PLT 224 12/31/2022   Lab Results  Component Value Date   ALT 72 (H) 12/31/2022   AST 40 12/31/2022   ALKPHOS 125 (H) 12/31/2022   BILITOT 0.3 12/31/2022   No results found for: "25OHVITD2", "25OHVITD3", "VD25OH"   Review of Systems  Constitutional:  Negative for fatigue, unexpected weight change and weight loss.  HENT:  Negative for hoarse voice, sinus pressure, sore throat and trouble swallowing.   Respiratory:  Negative for cough,  choking and wheezing.   Cardiovascular:  Negative for chest pain.  Gastrointestinal:  Negative for abdominal pain, blood in stool, constipation, dysphagia, heartburn, melena and nausea.  Musculoskeletal:  Negative for muscle weakness.    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   Fracture of temporal bone (HCC) 10/12/2012   Fracture of thoracic spine (HCC) 10/12/2012    Allergies  Allergen Reactions   Ibuprofen Anaphylaxis   Sulfa Antibiotics Other (See Comments)    Past Surgical History:  Procedure Laterality Date   COLONOSCOPY WITH PROPOFOL N/A 12/25/2020   Procedure: COLONOSCOPY WITH PROPOFOL;  Surgeon: Pasty Spillers, MD;  Location: [facility];  Service: Endoscopy;  Laterality: N/A;   ESOPHAGOGASTRODUODENOSCOPY (EGD) WITH PROPOFOL N/A 12/25/2020   Procedure: ESOPHAGOGASTRODUODENOSCOPY (EGD) WITH PROPOFOL;  Surgeon: Pasty Spillers, MD;  Location: [facility];  Service: Endoscopy;  Laterality: N/A;   KNEE ARTHROSCOPY W/ MENISCAL REPAIR  2018   KNEE ARTHROSCOPY WITH ANTERIOR CRUCIATE LIGAMENT (ACL) REPAIR  2018    Social History   Tobacco Use  Smoking status: Every Day    Current packs/day: 0.50    Average packs/day: 0.5 packs/day for 10.0 years (5.0 ttl pk-yrs)    Types: Cigarettes   Smokeless tobacco: Former    Types: Snuff    Quit date: 2014  Vaping Use   Vaping status: Never Used  Substance Use Topics   Alcohol use: No    Alcohol/week: 0.0 standard drinks of alcohol    Comment: occasional   Drug use: Yes    Types: Marijuana    Comment: 2 joints/day      Medication list has been reviewed and updated.  Current Meds  Medication Sig   gabapentin (NEURONTIN) 100 MG capsule Take 100 mg by mouth 2 (two) times daily.       01/17/2024    9:44 AM 07/01/2023    8:54 AM 12/31/2022    2:54 PM 06/29/2022    3:50 PM  GAD 7 : Generalized Anxiety Score  Nervous, Anxious, on Edge 1 0 0 0  Control/stop worrying 0 0 0 0  Worry too much - different things 1 0 0 0  Trouble relaxing 0 0 0 0  Restless 0 0 0 0  Easily annoyed or irritable 0 0 0 0  Afraid - awful might happen 0 0 0 0  Total GAD 7 Score 2 0 0 0  Anxiety Difficulty Not difficult at all Not difficult at all Not difficult at all Not difficult at all       01/17/2024    9:44 AM 07/01/2023    8:54 AM 12/31/2022    2:54 PM  Depression screen PHQ 2/9  Decreased Interest 1 0 0  Down, Depressed, Hopeless 1 0 0  PHQ - 2 Score 2 0 0  Altered sleeping 0 0 0  Tired, decreased energy 0 0 0  Change in appetite 0 0 0  Feeling bad or failure about yourself  0 0 0  Trouble concentrating 0 0 0  Moving slowly or fidgety/restless 0 0 0  Suicidal thoughts 0 0 0  PHQ-9 Score 2 0 0  Difficult doing work/chores Not difficult at all Not difficult at all Not difficult at all    BP Readings from Last 3 Encounters:  01/17/24 120/76  07/01/23 128/70  01/14/23 128/76    Physical Exam Vitals and nursing note reviewed.  Constitutional:      Appearance: He is well-developed.  HENT:     Head: Normocephalic and atraumatic.     Right Ear: Tympanic membrane, ear canal and external ear normal.     Left Ear: Tympanic membrane, ear canal and external ear normal.     Nose: Nose normal.     Mouth/Throat:     Dentition: Normal dentition.  Eyes:     General: Lids are normal. No scleral icterus.    Conjunctiva/sclera: Conjunctivae normal.     Pupils: Pupils are equal, round, and reactive to light.  Neck:     Thyroid: No thyromegaly.     Vascular: No carotid bruit, hepatojugular reflux or JVD.      Trachea: No tracheal deviation.  Cardiovascular:     Rate and Rhythm: Normal rate and regular rhythm.     Heart sounds: Normal heart sounds.  Pulmonary:     Effort: Pulmonary effort is normal.     Breath sounds: Normal breath sounds.  Abdominal:     General: Bowel sounds are normal.     Palpations: Abdomen is soft. There is no hepatomegaly, splenomegaly or mass.  Tenderness: There is no abdominal tenderness.     Hernia: There is no hernia in the left inguinal area.  Musculoskeletal:        General: Normal range of motion.     Cervical back: Normal range of motion and neck supple.  Lymphadenopathy:     Cervical: No cervical adenopathy.  Skin:    General: Skin is warm and dry.     Findings: No rash.  Neurological:     Mental Status: He is alert and oriented to person, place, and time.     Sensory: No sensory deficit.     Deep Tendon Reflexes: Reflexes are normal and symmetric. Reflexes normal.  Psychiatric:        Mood and Affect: Mood is not anxious or depressed.     Wt Readings from Last 3 Encounters:  01/17/24 269 lb 3.2 oz (122.1 kg)  07/01/23 265 lb (120.2 kg)  01/14/23 (!) 301 lb 4 oz (136.6 kg)    BP 120/76   Pulse 95   Ht 6\' 1"  (1.854 m)   Wt 269 lb 3.2 oz (122.1 kg)   SpO2 98%   BMI 35.52 kg/m   Assessment and Plan: 1. Gastroesophageal reflux disease without esophagitis (Primary) Chronic.  Controlled.  Stable.  Patient is currently doing well with current dosing of pantoprazole 40 mg once a day.  And will continue at current dosing. - pantoprazole (PROTONIX) 40 MG tablet; Take 1 tablet (40 mg total) by mouth daily.  Dispense: 90 tablet; Refill: 1  2. Steatosis of liver Chronic.  Perhaps controlled and we will obtain a CMP to look at electrolytes and AST/ALT.  Pending this we may need to add platelet count to evaluate for fib 4 level. - Comprehensive metabolic panel  3. Dyslipidemia Chronic.  Controlled.  Stable.  Patient has been given low-cholesterol  low triglyceride dietary guidelines.  Will check CMP for hepatic concerns as well as lipid panel for current level of control of LDL and triglycerides.  Patient is also noted to have decreased HDL in the past. - Comprehensive metabolic panel - Lipid Panel With LDL/HDL Ratio  4. Tobacco dependence Patient has been advised of the health risks of smoking and counseled concerning cessation of tobacco products. I spent over 3 minutes for discussion and to answer questions.      Elizabeth Sauer, MD
